# Patient Record
Sex: Female | Born: 2000 | Race: White | Hispanic: No | Marital: Single | Smoking: Never smoker
Health system: Southern US, Community
[De-identification: ages and names within clinical notes are randomized; demographics above are authoritative.]

## PROBLEM LIST (undated history)

## (undated) DIAGNOSIS — F419 Anxiety disorder, unspecified: Secondary | ICD-10-CM

## (undated) DIAGNOSIS — N390 Urinary tract infection, site not specified: Secondary | ICD-10-CM

## (undated) DIAGNOSIS — K589 Irritable bowel syndrome without diarrhea: Secondary | ICD-10-CM

## (undated) DIAGNOSIS — F32A Depression, unspecified: Secondary | ICD-10-CM

## (undated) HISTORY — DX: Anxiety disorder, unspecified: F41.9

## (undated) HISTORY — DX: Depression, unspecified: F32.A

## (undated) HISTORY — DX: Urinary tract infection, site not specified: N39.0

## (undated) HISTORY — DX: Irritable bowel syndrome, unspecified: K58.9

## (undated) HISTORY — PX: MOUTH SURGERY: SHX715

---
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
## 2021-03-02 ENCOUNTER — Other Ambulatory Visit: Payer: Self-pay

## 2021-03-02 ENCOUNTER — Encounter (HOSPITAL_COMMUNITY): Payer: Self-pay

## 2021-03-02 ENCOUNTER — Ambulatory Visit (HOSPITAL_COMMUNITY)
Admission: EM | Admit: 2021-03-02 | Discharge: 2021-03-02 | Disposition: A | Discharge status: Home / Self Care | Payer: PRIVATE HEALTH INSURANCE | Attending: Medical Oncology | Admitting: Medical Oncology

## 2021-03-02 DIAGNOSIS — N3001 Acute cystitis with hematuria: Secondary | ICD-10-CM

## 2021-03-02 LAB — POCT URINALYSIS DIPSTICK, ED / UC
Glucose, UA: 500 mg/dL — AB
Ketones, ur: 15 mg/dL — AB
Nitrite: POSITIVE — AB
Protein, ur: >=300 mg/dL — AB
Specific Gravity, Urine: <=1.005 (ref 1.005–1.030)
Urobilinogen, UA: >=8 mg/dL (ref 0.0–1.0)
pH: 5 (ref 5.0–8.0)

## 2021-03-02 LAB — CBG MONITORING, ED: Glucose-Capillary: 101 mg/dL — ABNORMAL HIGH (ref 70–99)

## 2021-03-02 MED ORDER — SULFAMETHOXAZOLE-TRIMETHOPRIM 800-160 MG PO TABS: 1.0000 | ORAL_TABLET | Freq: Two times a day (BID) | ORAL | 0 refills | Status: AC

## 2021-03-02 MED ORDER — SULFAMETHOXAZOLE-TRIMETHOPRIM 800-160 MG PO TABS: 1.0000 | ORAL_TABLET | Freq: Two times a day (BID) | ORAL | 0 refills | Status: DC

## 2021-03-02 NOTE — ED Triage Notes (Signed)
Pt in with c/o burning during urination and and hematuria that she noticed yesterday

## 2021-03-02 NOTE — ED Provider Notes (Signed)
[facility]    CSN: [id number] Arrival date & time: 03/02/21  1140      History   Chief Complaint Chief Complaint  Patient presents with  . burning during urination  . Hematuria    HPI Susan Buchanan is a 20 y.o. female.   HPI   Dysuria: Patient reports that for the past 2 days she has had dysuria and this morning she had some hematuria.  She suspects she has a urinary tract infection.  She has not had any significant abdominal pains but has had some frequency.  No fevers, vomiting or diarrhea.  She has tried to stay hydrated with water which is helped symptoms some.  She also took an Azo which did help symptoms some. LMC: 2 weeks ago.   History reviewed. No pertinent past medical history.  There are no problems to display for this patient.   History reviewed. No pertinent surgical history.  OB History   No obstetric history on file.      Home Medications    Prior to Admission medications   Medication Sig Start Date End Date Taking? Authorizing Provider  Biotin 1 MG CAPS Take by mouth.    [provider]  loperamide (IMODIUM) 2 MG capsule Take 1 capsule (2 mg total) by mouth 4 (four) times daily as needed for diarrhea or loose stools. 07/08/20   Wallis Bamberg, PA-C  Melatonin 5 MG TABS Take 5 mg by mouth at bedtime as needed (sleep).    [provider]  sulfamethoxazole-trimethoprim (BACTRIM DS) 800-160 MG tablet Take 1 tablet by mouth 2 (two) times daily for 3 days. 03/02/21 03/05/21  Rushie Chestnut, PA-C    Family History Family History  Family history unknown: Yes    Social History Social History   Tobacco Use  . Smoking status: Never Smoker  . Smokeless tobacco: Never Used  Vaping Use  . Vaping Use: Never used  Substance Use Topics  . Alcohol use: Never     Allergies   Latex   Review of Systems Review of Systems  As stated above in HPI Physical Exam Triage Vital Signs ED Triage Vitals  Enc Vitals Group     BP  03/02/21 1250 (!) 111/54     Pulse Rate 03/02/21 1248 77     Resp 03/02/21 1248 19     Temp 03/02/21 1248 98.3 F (36.8 C)     Temp src --      SpO2 03/02/21 1248 100 %     Weight --      Height --      Head Circumference --      Peak Flow --      Pain Score 03/02/21 1246 0     Pain Loc --      Pain Edu? --      Excl. in GC? --    No data found.  Updated Vital Signs BP (!) 111/54   Pulse 77   Temp 98.3 F (36.8 C)   Resp 19   LMP 02/15/2021   SpO2 100%   Physical Exam Vitals and nursing note reviewed.  Constitutional:      General: She is not in acute distress.    Appearance: Normal appearance. She is not ill-appearing, toxic-appearing or diaphoretic.  HENT:     Head: Normocephalic.  Cardiovascular:     Rate and Rhythm: Normal rate and regular rhythm.     Heart sounds: Normal heart sounds.  Pulmonary:  Effort: Pulmonary effort is normal.     Breath sounds: Normal breath sounds.  Abdominal:     General: Abdomen is flat. Bowel sounds are normal. There is no distension.     Palpations: Abdomen is soft. There is no mass.     Tenderness: There is no abdominal tenderness. There is no right CVA tenderness, left CVA tenderness, guarding or rebound.     Hernia: No hernia is present.  Musculoskeletal:     Cervical back: Normal range of motion and neck supple.  Lymphadenopathy:     Cervical: No cervical adenopathy.  Neurological:     Mental Status: She is alert.      UC Treatments / Results  Labs (all labs ordered are listed, but only abnormal results are displayed) Labs Reviewed  POCT URINALYSIS DIPSTICK, ED / UC - Abnormal; Notable for the following components:      Result Value   Glucose, UA 500 (*)    Bilirubin Urine MODERATE (*)    Ketones, ur 15 (*)    Hgb urine dipstick MODERATE (*)    Protein, ur >=300 (*)    Nitrite POSITIVE (*)    Leukocytes,Ua LARGE (*)    All other components within normal limits  CBG MONITORING, ED - Abnormal; Notable for the  following components:   Glucose-Capillary 101 (*)    All other components within normal limits    EKG   Radiology No results found.  Procedures Procedures (including critical care time)  Medications Ordered in UC Medications - No data to display  Initial Impression / Assessment and Plan / UC Course  I have reviewed the triage vital signs and the nursing notes.  Pertinent labs & imaging results that were available during my care of the patient were reviewed by me and considered in my medical decision making (see chart for details).     New.  Her point-of-care glucose testing today is 101.  Her UA result is likely skewed from her Azo.  I am going to have her hydrate with water and work on to treat her for urinary tract infection.  Discussed how to take her medication along with common potential side effects and precautions.   Final Clinical Impressions(s) / UC Diagnoses   Final diagnoses:  Acute cystitis with hematuria   Discharge Instructions   None    ED Prescriptions    Medication Sig Dispense Auth. Provider   sulfamethoxazole-trimethoprim (BACTRIM DS) 800-160 MG tablet  (Status: Discontinued) Take 1 tablet by mouth 2 (two) times daily for 7 days. 14 tablet Garhett Bernhard M, PA-C   sulfamethoxazole-trimethoprim (BACTRIM DS) 800-160 MG tablet Take 1 tablet by mouth 2 (two) times daily for 3 days. 6 tablet Rushie Chestnut, [place]     PDMP not reviewed this encounter.   Rushie Chestnut, [place] 03/02/21 1344

## 2022-09-13 ENCOUNTER — Emergency Department (HOSPITAL_COMMUNITY)
Admission: EM | Admit: 2022-09-13 | Discharge: 2022-09-14 | Disposition: A | Discharge status: Home / Self Care | Payer: No Typology Code available for payment source | Attending: Emergency Medicine | Admitting: Emergency Medicine

## 2022-09-13 ENCOUNTER — Other Ambulatory Visit: Payer: Self-pay

## 2022-09-13 ENCOUNTER — Encounter (HOSPITAL_COMMUNITY): Payer: Self-pay | Admitting: Emergency Medicine

## 2022-09-13 DIAGNOSIS — R197 Diarrhea, unspecified: Secondary | ICD-10-CM | POA: Insufficient documentation

## 2022-09-13 DIAGNOSIS — Z91040 Latex allergy status: Secondary | ICD-10-CM | POA: Diagnosis not present

## 2022-09-13 DIAGNOSIS — R1084 Generalized abdominal pain: Secondary | ICD-10-CM | POA: Insufficient documentation

## 2022-09-13 DIAGNOSIS — R1110 Vomiting, unspecified: Secondary | ICD-10-CM | POA: Insufficient documentation

## 2022-09-13 DIAGNOSIS — Z20822 Contact with and (suspected) exposure to covid-19: Secondary | ICD-10-CM | POA: Diagnosis not present

## 2022-09-13 LAB — COMPREHENSIVE METABOLIC PANEL
ALT: 17 U/L (ref 0–44)
AST: 17 U/L (ref 15–41)
Albumin: 4.4 g/dL (ref 3.5–5.0)
Alkaline Phosphatase: 65 U/L (ref 38–126)
Anion gap: 10 (ref 5–15)
BUN: 11 mg/dL (ref 6–20)
CO2: 23 mmol/L (ref 22–32)
Calcium: 9.9 mg/dL (ref 8.9–10.3)
Chloride: 106 mmol/L (ref 98–111)
Creatinine, Ser: 0.64 mg/dL (ref 0.44–1.00)
GFR, Estimated: >60 mL/min (ref >60)
Glucose, Bld: 86 mg/dL (ref 70–99)
Potassium: 3.9 mmol/L (ref 3.5–5.1)
Sodium: 139 mmol/L (ref 135–145)
Total Bilirubin: 0.6 mg/dL (ref 0.3–1.2)
Total Protein: 8.2 g/dL — ABNORMAL HIGH (ref 6.5–8.1)

## 2022-09-13 LAB — CBC WITH DIFFERENTIAL/PLATELET
Abs Immature Granulocytes: 0.03 10*3/uL (ref 0.00–0.07)
Basophils Absolute: 0 10*3/uL (ref 0.0–0.1)
Basophils Relative: 0 %
Eosinophils Absolute: 0 10*3/uL (ref 0.0–0.5)
Eosinophils Relative: 0 %
HCT: 39.6 % (ref 36.0–46.0)
Hemoglobin: 13.5 g/dL (ref 12.0–15.0)
Immature Granulocytes: 0 %
Lymphocytes Relative: 21 %
Lymphs Abs: 2.2 10*3/uL (ref 0.7–4.0)
MCH: 29.7 pg (ref 26.0–34.0)
MCHC: 34.1 g/dL (ref 30.0–36.0)
MCV: 87 fL (ref 80.0–100.0)
Monocytes Absolute: 0.5 10*3/uL (ref 0.1–1.0)
Monocytes Relative: 5 %
Neutro Abs: 7.6 10*3/uL (ref 1.7–7.7)
Neutrophils Relative %: 74 %
Platelets: 356 10*3/uL (ref 150–400)
RBC: 4.55 MIL/uL (ref 3.87–5.11)
RDW: 11.9 % (ref 11.5–15.5)
WBC: 10.3 10*3/uL (ref 4.0–10.5)
nRBC: 0 % (ref 0.0–0.2)

## 2022-09-13 LAB — RESP PANEL BY RT-PCR (FLU A&B, COVID) ARPGX2
Influenza A by PCR: NEGATIVE
Influenza B by PCR: NEGATIVE
SARS Coronavirus 2 by RT PCR: NEGATIVE

## 2022-09-13 LAB — I-STAT BETA HCG BLOOD, ED (MC, WL, AP ONLY): I-stat hCG, quantitative: <5 m[IU]/mL (ref <5)

## 2022-09-13 LAB — LIPASE, BLOOD: Lipase: 29 U/L (ref 11–51)

## 2022-09-13 NOTE — ED Triage Notes (Signed)
Presents for abd pain and diarrhea x 1 week. Suprapubic cramping and stabbing is primary concern, bloating. Has been intermittent for 1 year. Some blood noted in stool, dark; nausea. Deneis fever, chill Denies previous abd surgeries Currently menstruating.

## 2022-09-13 NOTE — ED Provider Triage Note (Signed)
Emergency Medicine Provider Triage Evaluation Note  Susan Buchanan , a 21 y.o. female  was evaluated in triage.  Pt complains of diarrhea.  History is given by the patient and her mother at bedside.  She has been having abdominal pain and abdominal symptoms for about a year but has not had an evaluation.  She complains of intermittent diarrhea and constipation.  This week she has had pure diarrhea.  Constant epigastric and lower abdominal pain and cramping.  She has a history of lactose intolerance.  She has had nausea without vomiting..  Review of Systems  Positive: Diarrhea Negative: Fever  Physical Exam  BP 122/76 (BP Location: Left Arm)   Pulse 72   Temp 98.2 F (36.8 C) (Oral)   Resp 14   SpO2 100%  Gen:   Awake, no distress   Resp:  Normal effort  MSK:   Moves extremities without difficulty  Other:  Mild epigastric abdominal tenderness  Medical Decision Making  Medically screening exam initiated at 8:13 PM.  Appropriate orders placed.  Concetta Wilmer was informed that the remainder of the evaluation will be completed by another provider, this initial triage assessment does not replace that evaluation, and the importance of remaining in the ED until their evaluation is complete.  Work-up initiated   Margarita Mail, PA-C 09/13/22 2015

## 2022-09-14 ENCOUNTER — Encounter (HOSPITAL_COMMUNITY): Payer: Self-pay

## 2022-09-14 ENCOUNTER — Emergency Department (HOSPITAL_COMMUNITY): Payer: No Typology Code available for payment source

## 2022-09-14 DIAGNOSIS — R1084 Generalized abdominal pain: Secondary | ICD-10-CM | POA: Diagnosis not present

## 2022-09-14 LAB — URINALYSIS, ROUTINE W REFLEX MICROSCOPIC
Bilirubin Urine: NEGATIVE
Glucose, UA: NEGATIVE mg/dL
Ketones, ur: 20 mg/dL — AB
Leukocytes,Ua: NEGATIVE
Nitrite: NEGATIVE
Protein, ur: NEGATIVE mg/dL
Specific Gravity, Urine: 1.029 (ref 1.005–1.030)
pH: 5 (ref 5.0–8.0)

## 2022-09-14 MED ORDER — LIDOCAINE VISCOUS HCL 2 % MT SOLN
15.0000 mL | Freq: Once | OROMUCOSAL | Status: AC
Start: 2022-09-14 — End: 2022-09-14
  Administered 2022-09-14: 15 mL via ORAL
  Filled 2022-09-14: qty 15

## 2022-09-14 MED ORDER — OMEPRAZOLE 20 MG PO CPDR: 20.0000 mg | DELAYED_RELEASE_CAPSULE | Freq: Every day | ORAL | 0 refills | Status: DC

## 2022-09-14 MED ORDER — ALUM & MAG HYDROXIDE-SIMETH 200-200-20 MG/5ML PO SUSP
30.0000 mL | Freq: Once | ORAL | Status: AC
  Administered 2022-09-14: 30 mL via ORAL
  Filled 2022-09-14: qty 30

## 2022-09-14 NOTE — ED Provider Notes (Signed)
[facility] EMERGENCY DEPARTMENT Provider Note   CSN: [id number] Arrival date & time: 09/13/22  1751     History  Chief Complaint  Patient presents with   Abdominal Pain    Susan Buchanan is a 21 y.o. female.   Abdominal Pain    Patient without documented comorbidities presents today due to abdominal pain x1 year.  It is worse after she eats, associated sometimes with nonbilious nonbloody emesis other times with immediate diarrhea.  Pain is like a cramping pain, sometimes is to the upper abdomen, sometimes it suprapubic.  She states for the last year she has been having episodes of constipation followed by bouts of diarrhea per time she eats.  Denies any dark and tarry stool, endorses slight streaks of bright red blood in her stool intermittently.  She has not tried any over-the-counter medicine other than Tylenol which helps somewhat.  No history of GERD or reflux, not sexually active in 1 year and denies any vaginal discharge, pelvic pain, dysuria.  No previous abdominal surgeries.  Denies illicit drug use, denies alcohol.  Home Medications Prior to Admission medications   Medication Sig Start Date End Date Taking? Authorizing Provider  omeprazole (PRILOSEC) 20 MG capsule Take 1 capsule (20 mg total) by mouth daily. 09/14/22  Yes Sherrill Raring, PA-C  loperamide (IMODIUM) 2 MG capsule Take 1 capsule (2 mg total) by mouth 4 (four) times daily as needed for diarrhea or loose stools. Patient not taking: Reported on 09/14/2022 07/08/20   Jaynee Eagles, PA-C      Allergies    Latex and Wound dressing adhesive    Review of Systems   Review of Systems  Gastrointestinal:  Positive for abdominal pain.    Physical Exam Updated Vital Signs BP (!) 131/94   Pulse 74   Temp 98.6 F (37 C) (Oral)   Resp 16   Ht 5\' 9"  (1.753 m)   Wt 90.7 kg   LMP 09/12/2022 (Exact Date)   SpO2 100%   BMI 29.53 kg/m  Physical Exam Vitals and nursing note reviewed. Exam conducted with a  chaperone present.  Constitutional:      Appearance: Normal appearance. She is obese.  HENT:     Head: Normocephalic and atraumatic.  Eyes:     General: No scleral icterus.       Right eye: No discharge.        Left eye: No discharge.     Extraocular Movements: Extraocular movements intact.     Pupils: Pupils are equal, round, and reactive to light.  Cardiovascular:     Rate and Rhythm: Normal rate and regular rhythm.     Pulses: Normal pulses.     Heart sounds: Normal heart sounds. No murmur heard.    No friction rub. No gallop.  Pulmonary:     Effort: Pulmonary effort is normal. No respiratory distress.     Breath sounds: Normal breath sounds.  Abdominal:     General: Abdomen is flat. Bowel sounds are normal. There is no distension.     Palpations: Abdomen is soft.     Tenderness: There is generalized abdominal tenderness. There is no guarding or rebound. Positive signs include Murphy's sign.     Hernia: No hernia is present.     Comments: Abdomen is soft, generalized abdominal tenderness.  Positive Murphy sign, no rebound, guarding, rigidity.  Genitourinary:    Comments: Deferred Skin:    General: Skin is warm and dry.     Coloration:  Skin is not jaundiced.  Neurological:     Mental Status: She is alert. Mental status is at baseline.     Coordination: Coordination normal.     ED Results / Procedures / Treatments   Labs (all labs ordered are listed, but only abnormal results are displayed) Labs Reviewed  COMPREHENSIVE METABOLIC PANEL - Abnormal; Notable for the following components:      Result Value   Total Protein 8.2 (*)    All other components within normal limits  URINALYSIS, ROUTINE W REFLEX MICROSCOPIC - Abnormal; Notable for the following components:   APPearance HAZY (*)    Hgb urine dipstick LARGE (*)    Ketones, ur 20 (*)    Bacteria, UA RARE (*)    All other components within normal limits  RESP PANEL BY RT-PCR (FLU A&B, COVID) ARPGX2  CBC WITH  DIFFERENTIAL/PLATELET  LIPASE, BLOOD  I-STAT BETA HCG BLOOD, ED ([facility], [facility], [facility] ONLY)    EKG None  Radiology US Abdomen Limited RUQ (LIVER/GB)  Result Date: 09/14/2022 CLINICAL DATA:  Right upper quadrant abdominal pain EXAM: ULTRASOUND ABDOMEN LIMITED RIGHT UPPER QUADRANT COMPARISON:  None available FINDINGS: Gallbladder: Gallstones: None Sludge: None Gallbladder Wall: Within normal limits Pericholecystic fluid: None Sonographic Murphy's Sign: Negative per technologist Common bile duct: Diameter: 2 mm Liver: Parenchymal echogenicity: Within normal limits Contours: Normal Lesions: None Portal vein: Patent.  Hepatopetal flow Other: None. IMPRESSION: No significant sonographic abnormality of the liver or gallbladder. Electronically Signed   By: Acquanetta Belling M.D.   On: 09/14/2022 10:30    Procedures Procedures    Medications Ordered in ED Medications  alum & mag hydroxide-simeth (MAALOX/MYLANTA) 200-200-20 MG/5ML suspension 30 mL (30 mLs Oral Given 09/14/22 0841)    And  lidocaine (XYLOCAINE) 2 % viscous mouth solution 15 mL (15 mLs Oral Given 09/14/22 0841)    ED Course/ Medical Decision Making/ A&P                           Medical Decision Making Amount and/or Complexity of Data Reviewed Radiology: ordered.  Risk OTC drugs. Prescription drug management.   Patient presents due to abdominal pain, vomiting, diarrhea.  Differential includes but not limited to ruptured ectopic pregnancy, ovarian torsion, TOA, GERD, gastritis, ovarian cyst, UTI, pancreatitis, pyelonephritis, gastric ulcer, colitis, IBD, PID, cholecystitis, appendicitis, SBO, COVID.  Patient's abdomen is soft, she is nontoxic-appearing. Generalized abdominal tenderness but slightly worse to right upper quadrant with positive Murphy sign..  No peritoneal signs.  Nonseptic appearing. -BP (!) 131/94   Pulse 74   Temp 98.6 F (37 C) (Oral)   Resp 16   Ht 5\' 9"  (1.753 m)   Wt 90.7 kg   LMP 09/12/2022 (Exact Date)   SpO2  100%   BMI 29.53 kg/m   Patient's mother is independent historian.  She left the room when I question the patient about sexual history.  I reviewed external medical records, patient has been seen in the ED for generalized abdominal pain but has not had PCP follow-up or gastroenterology follow-up.  I ordered, viewed and interpreted laboratory work-up. -CBC without leukocytosis or anemia. -Lipase is within normal limits, no pancreatitis -CMP without gross electrolyte derangement, AKI or transaminitis. -Patient is not pregnant, not ectopic. -Respiratory panel is negative for COVID or flu. -UA shows some slight ketonuria, no signs of UTI.  There is large amount hematuria patient is currently on her menstrual cycle.  No CVA tenderness, doubt nephrolithiasis.  Given positive Murphy sign with history of postprandial abdominal pain we will proceed with right upper quadrant ultrasound to evaluate cholelithiasis versus cholecystitis.  I ordered, viewed and interpreted right upper quadrant ultrasound. Negative for gallstones, not consistent with cholecystitis.  Agree with radiologist interpretation.  I consider getting a CT of the abdomen but given the pain is generalized, has been going on for over a year and the abdominal exam is relatively benign I do not think a CT scan would be particularly helpful.    Patient is not having any GU complaints, no risk factors of sexual activity.  I considered pelvic but after shared decision making I do not think indicated.  Presentation is not consistent with ovarian cyst or torsion.    Although triage note mentions dark stool, patient states this more streaks of bright red blood in the toilet with urination and bowel movements intermittently x 1 year.  She notes it mostly during her menstrual cycles, denies rectal pain.  Given normal hemoglobin and chronicity of symptoms I do not think patient is having an acute GI bleed.  I think presentation is most consistent  with gastritis versus GERD versus IBD versus IBS versus other.  Patient is stable, I do not think additional work-up or hospitalization is indicated at this time.  We will start on acid suppressing medicine and provide GI follow-up.  Return precautions were discussed with patient who verbalized understanding and agreement with this plan.  I ordered GI cocktail, on reevaluation this improved the pain.  I reviewed patient's home medication list, will start patient on Prilosec given upper abdominal pain postprandial.         Final Clinical Impression(s) / ED Diagnoses Final diagnoses:  Generalized abdominal pain    Rx / DC Orders ED Discharge Orders          Ordered    omeprazole (PRILOSEC) 20 MG capsule  Daily        09/14/22 1037              Sherrill Raring, PA-C 09/14/22 1042    Blanchie Dessert, MD 09/14/22 1102

## 2022-09-14 NOTE — Discharge Instructions (Addendum)
Your work-up today was very reassuring.  The ultrasound does not show any gallstones or abnormalities.  I would like you to start taking acid suppressant medicine Prilosec.  Take 30 minutes before your first meal in the morning daily until you are seen by gastroenterology.  Try and avoid foods that are acidic in nature and see if that makes any difference in your symptoms.  Call gastroenterology Dr. Loletha Carrow and schedule an appointment for reevaluation.  If you are still having the symptoms of diarrhea alternating with constipation I do think we need GI evaluation and work-up for possible irritable bowel disease.  Return to the ED for severe pain that is 1 side of your body, inability to eat or drink without vomiting or new concerning symptoms.

## 2022-09-14 NOTE — ED Notes (Signed)
Pt provided written and verbal d/c instructions and rx home. Pt verbalizes understanding. Pt left dept. Via ambulation with family.

## 2022-11-16 ENCOUNTER — Other Ambulatory Visit (INDEPENDENT_AMBULATORY_CARE_PROVIDER_SITE_OTHER): Payer: PRIVATE HEALTH INSURANCE

## 2022-11-16 ENCOUNTER — Encounter: Payer: Self-pay | Admitting: Internal Medicine

## 2022-11-16 ENCOUNTER — Ambulatory Visit (INDEPENDENT_AMBULATORY_CARE_PROVIDER_SITE_OTHER): Payer: PRIVATE HEALTH INSURANCE | Admitting: Internal Medicine

## 2022-11-16 VITALS — BP 118/82 | HR 94 | Ht 69.0 in | Wt 219.2 lb

## 2022-11-16 DIAGNOSIS — R198 Other specified symptoms and signs involving the digestive system and abdomen: Secondary | ICD-10-CM

## 2022-11-16 DIAGNOSIS — R1013 Epigastric pain: Secondary | ICD-10-CM | POA: Diagnosis not present

## 2022-11-16 DIAGNOSIS — K219 Gastro-esophageal reflux disease without esophagitis: Secondary | ICD-10-CM

## 2022-11-16 DIAGNOSIS — K625 Hemorrhage of anus and rectum: Secondary | ICD-10-CM

## 2022-11-16 DIAGNOSIS — R1030 Lower abdominal pain, unspecified: Secondary | ICD-10-CM

## 2022-11-16 LAB — C-REACTIVE PROTEIN: CRP: 1.1 mg/dL (ref 0.5–20.0)

## 2022-11-16 LAB — H. PYLORI ANTIBODY, IGG: H Pylori IgG: NEGATIVE

## 2022-11-16 LAB — TSH: TSH: 1.42 u[IU]/mL (ref 0.35–5.50)

## 2022-11-16 MED ORDER — HYDROCORTISONE (PERIANAL) 2.5 % EX CREA: 1.0000 | TOPICAL_CREAM | Freq: Two times a day (BID) | CUTANEOUS | 1 refills | Status: AC

## 2022-11-16 MED ORDER — OMEPRAZOLE 20 MG PO CPDR: 20.0000 mg | DELAYED_RELEASE_CAPSULE | Freq: Every day | ORAL | 2 refills | Status: DC

## 2022-11-16 MED ORDER — DICYCLOMINE HCL 10 MG PO CAPS: 10.0000 mg | ORAL_CAPSULE | Freq: Three times a day (TID) | ORAL | 2 refills | Status: DC | PRN

## 2022-11-16 NOTE — Patient Instructions (Addendum)
If you are age 20 or younger, your body mass index should be between 19-25. Your Body mass index is 32.38 kg/m. If this is out of the aformentioned range listed, please consider follow up with your Primary Care Provider.  ________________________________________________________  The [facility] providers would like to encourage you to use [facility] to communicate with providers for non-urgent requests or questions.  Due to long hold times on the telephone, sending your provider a message by [facility] may be a faster and more efficient way to get a response.  Please allow 48 business hours for a response.  Please remember that this is for non-urgent requests.  _______________________________________________________  Your provider has requested that you go to the basement level for lab work before leaving today. Press "B" on the elevator. The lab is located at the first door on the left as you exit the elevator.  We have sent the following medications to your pharmacy for you to pick up at your convenience:  START: Bentyl 10mg  one tablet three times daily as needed. START: Anusol cream apply to rectum twice daily for 7 days  CONTINUE: Omeprazole 20mg  one capsule daily.   USE: recticare as directed.   You are scheduled to follow up in our office on 12-26-22 at 3:40pm  Due to recent changes in healthcare laws, you may see the results of your imaging and laboratory studies on MyChart before your provider has had a chance to review them.  We understand that in some cases there may be results that are confusing or concerning to you. Not all laboratory results come back in the same time frame and the provider may be waiting for multiple results in order to interpret others.  Please give 48 hours in order for your provider to thoroughly review all the results before contacting the office for clarification of your results.   Thank you for entrusting me with your care and choosing [facility].  Dr  [place]

## 2022-11-16 NOTE — Progress Notes (Signed)
Chief Complaint: Ab pain  HPI : 21 year old female with history of depression, anxiety, and obesity presents with ab pain  She has had ab pain for the last 2-3 years. This pain is located in the lower abdomen and epigastric area. The pain feels like a stabbing and burning sensation. The pain will improve after she lays down. Taking two Advil and Tylenol will help with the pain. She takes Advil usually once every few days. Having a BM does not affect the pain. Eating will sometimes help and other times worsen the pain. She feels better when she doesn't eat. She took omeprazole 20 mg QD, which helped with the acid reflux symptoms but not with the pain. She endorses nausea but denies vomiting. She has random BMs. She will have alternating constipation and diarrhea, but she tends to be more constipated. She has rectal bleeding on occasion, particularly when she is constipated. The bleeding is only on the toilet paper. Also has some rectal pain. Maternal grandmother had rectal cancer, diverticulitis, and IBS. Denies dysphagia. Denies prior colonoscopy or EGD. Denies weight loss.   Wt Readings from Last 3 Encounters:  11/16/22 219 lb 4 oz (99.5 kg)  09/13/22 200 lb (90.7 kg)  08/17/13 98 lb (44.5 kg) (76 %, Z= 0.70)*   * Growth percentiles are based on CDC (Girls, 2-20 Years) data.   Past Medical History:  Diagnosis Date   Anxiety    Depression    IBS (irritable bowel syndrome)    UTI (urinary tract infection)    Past Surgical History:  Procedure Laterality Date   MOUTH SURGERY     2021. Due to a baby tooth that didnt come out and an abcess   Family History  Problem Relation Age of Onset   Rectal cancer Maternal Grandmother    Clotting disorder Maternal Grandmother    Irritable bowel syndrome Maternal Grandmother    Prostate cancer Maternal Grandfather    Esophageal cancer Neg Hx    Social History   Tobacco Use   Smoking status: Never   Smokeless tobacco: Never  Vaping Use    Vaping Use: Never used  Substance Use Topics   Alcohol use: Never   Drug use: Never   Current Outpatient Medications  Medication Sig Dispense Refill   Acetaminophen (TYLENOL PO) Take 1 tablet by mouth as needed.     IBUPROFEN PO Take 1 tablet by mouth as needed.     omeprazole (PRILOSEC) 20 MG capsule Take 1 capsule (20 mg total) by mouth daily. 60 capsule 0   No current facility-administered medications for this visit.   Allergies  Allergen Reactions   Latex Other (See Comments)    Allergic to bandaids   Wound Dressing Adhesive Rash   Review of Systems: All systems reviewed and negative except where noted in HPI.   Physical Exam: BP 118/82   Pulse 94   Ht 5\' 9"  (1.753 m)   Wt 219 lb 4 oz (99.5 kg)   BMI 32.38 kg/m  Constitutional: Pleasant,well-developed, female in no acute distress. HEENT: Normocephalic and atraumatic. Conjunctivae are normal. No scleral icterus. Cardiovascular: Normal rate, regular rhythm.  Pulmonary/chest: Effort normal and breath sounds normal. No wheezing, rales or rhonchi. Abdominal: Soft, nondistended, nontender. Bowel sounds active throughout. There are no masses palpable. No hepatomegaly. Rectal: Internal hemorrhoids present on rectal exam Extremities: No edema Neurological: Alert and oriented to person place and time. Skin: Skin is warm and dry. No rashes noted. Psychiatric: Normal mood and  affect. Behavior is normal.  Labs 08/2022: CBC and CMP unremarkable. Lipase nml.  RUQ U/S 09/14/22: IMPRESSION: No significant sonographic abnormality of the liver or gallbladder.  ASSESSMENT AND PLAN: Epigastric and lower abdominal pain GERD Alternating constipation and diarrhea Rectal bleeding Rectal pain Hemorrhoids Patient presents with abdominal pain for the last 2-3 years. She was started on PPI therapy and has had some benefit in her GERD but not with her ab pain. She does have irregular bowel habits that could suggest IBS as a source of her ab  pain. Alternatively could consider PUD with her history of NSAID use or GERD. Will start her on some therapies for IBS and rule out alternative sources of ab pain with labs. Her rectal bleeding and pain is likely due to hemorrhoids so will start her on some Recticare and Anusol cream. I encouraged her to try to avoid straining and sitting on the toilet for long periods of time.  - Low FODMAP diet - Check CRP, TSH, TTG IgA, IgA, H pylori antibody - Continue omeprazole 20 mg QD. Refill - Start Bentyl 10 mg TID PRN - Anusol cream BID for 7 days - Recticare PRN. Will give samples. - RTC 6 weeks  Eulah Pont, MD  I spent 60 minutes of time, including in depth chart review, independent review of results as outlined above, communicating results with the patient directly, face-to-face time with the patient, coordinating care, ordering studies and medications as appropriate, and documentation.

## 2022-11-17 LAB — TISSUE TRANSGLUTAMINASE, IGA: (tTG) Ab, IgA: <1 U/mL

## 2022-11-17 LAB — IGA: Immunoglobulin A: 220 mg/dL (ref 47–310)

## 2022-12-12 ENCOUNTER — Ambulatory Visit
Admission: EM | Admit: 2022-12-12 | Discharge: 2022-12-12 | Disposition: A | Discharge status: Home / Self Care | Payer: PRIVATE HEALTH INSURANCE | Attending: Family Medicine | Admitting: Family Medicine

## 2022-12-12 DIAGNOSIS — N3090 Cystitis, unspecified without hematuria: Secondary | ICD-10-CM | POA: Diagnosis present

## 2022-12-12 LAB — POCT URINALYSIS DIP (MANUAL ENTRY)
Bilirubin, UA: NEGATIVE
Glucose, UA: 100 mg/dL — AB
Ketones, POC UA: NEGATIVE mg/dL
Nitrite, UA: POSITIVE — AB
Protein Ur, POC: 30 mg/dL — AB
Spec Grav, UA: 1.02 (ref 1.010–1.025)
Urobilinogen, UA: 1 E.U./dL
pH, UA: 6.5 (ref 5.0–8.0)

## 2022-12-12 LAB — POCT URINE PREGNANCY: Preg Test, Ur: NEGATIVE

## 2022-12-12 MED ORDER — CEPHALEXIN 500 MG PO CAPS: 500.0000 mg | ORAL_CAPSULE | Freq: Two times a day (BID) | ORAL | 0 refills | Status: DC

## 2022-12-12 NOTE — ED Provider Notes (Signed)
[place]    ASSESSMENT & PLAN:  1. Cystitis    Meds ordered this encounter  Medications   cephALEXin (KEFLEX) 500 MG capsule    Sig: Take 1 capsule (500 mg total) by mouth 2 (two) times daily.    Dispense:  10 capsule    Refill:  0   No signs of pyelonephritis. Discussed. Urine culture sent. Will notify patient of any significant results. Will follow up with her PCP or here if not showing improvement over the next 48 hours, sooner if needed.  Outlined signs and symptoms indicating need for more acute intervention. Patient verbalized understanding. After Visit Summary given.  SUBJECTIVE:  Susan Buchanan is a 21 y.o. female who complains of urinary frequency, urgency and dysuria for the past 3 days. Without associated flank pain, fever, chills, vaginal discharge or bleeding. Gross hematuria: not present. No specific aggravating or alleviating factors reported. No LE edema. Normal PO intake without n/v/d. Without specific abdominal pain. Ambulatory without difficulty. No tx PTA.  LMP: Patient's last menstrual period was 12/05/2022 (approximate).  OBJECTIVE:  Vitals:   12/12/22 1908  BP: 118/83  Pulse: 80  Resp: 16  Temp: 97.9 F (36.6 C)  SpO2: 98%   General appearance: alert; no distress HENT: oropharynx: moist Lungs: unlabored respirations Abdomen: benign Back: no CVA tenderness Extremities: no edema; symmetrical with no gross deformities Skin: warm and dry Neurologic: normal gait Psychological: alert and cooperative; normal mood and affect  Labs Reviewed  POCT URINALYSIS DIP (MANUAL ENTRY) - Abnormal; Notable for the following components:      Result Value   Color, UA orange (*)    Glucose, UA =100 (*)    Blood, UA small (*)    Protein Ur, POC =30 (*)    Nitrite, UA Positive (*)    Leukocytes, UA Trace (*)    All other components within normal limits  URINE CULTURE  POCT URINE PREGNANCY    Allergies  Allergen Reactions   Latex Other (See  Comments)    Allergic to bandaids   Wound Dressing Adhesive Rash    Past Medical History:  Diagnosis Date   Anxiety    Depression    IBS (irritable bowel syndrome)    UTI (urinary tract infection)    Social History   Socioeconomic History   Marital status: Single    Spouse name: Not on file   Number of children: Not on file   Years of education: Not on file   Highest education level: Not on file  Occupational History   Not on file  Tobacco Use   Smoking status: Never   Smokeless tobacco: Never  Vaping Use   Vaping Use: Never used  Substance and Sexual Activity   Alcohol use: Never   Drug use: Never   Sexual activity: Not on file  Other Topics Concern   Not on file  Social History Narrative   Not on file   Social Determinants of Health   Financial Resource Strain: Not on file  Food Insecurity: Not on file  Transportation Needs: Not on file  Physical Activity: Not on file  Stress: Not on file  Social Connections: Not on file  Intimate Partner Violence: Not on file   Family History  Problem Relation Age of Onset   Rectal cancer Maternal Grandmother    Clotting disorder Maternal Grandmother    Irritable bowel syndrome Maternal Grandmother    Prostate cancer Maternal Grandfather    Esophageal cancer Neg Hx  Mardella Layman, MD 12/12/22 Barry Brunner

## 2022-12-12 NOTE — Discharge Instructions (Addendum)
You have had labs (urine cuture) sent today. We will call you with any significant abnormalities or if there is need to begin or change treatment or pursue further follow up.  You may also review your test results online through MyChart. If you do not have a MyChart account, instructions to sign up should be on your discharge paperwork.

## 2022-12-12 NOTE — ED Triage Notes (Signed)
Pt presents to [facility] with co of dysuria for 3 days. Pt has taken otc for urinary pain.

## 2022-12-15 LAB — URINE CULTURE: Culture: >=100000 — AB

## 2022-12-26 ENCOUNTER — Ambulatory Visit (INDEPENDENT_AMBULATORY_CARE_PROVIDER_SITE_OTHER): Payer: PRIVATE HEALTH INSURANCE | Admitting: Internal Medicine

## 2022-12-26 ENCOUNTER — Encounter: Payer: Self-pay | Admitting: Internal Medicine

## 2022-12-26 VITALS — BP 110/78 | HR 110 | Ht 69.0 in | Wt 216.0 lb

## 2022-12-26 DIAGNOSIS — R198 Other specified symptoms and signs involving the digestive system and abdomen: Secondary | ICD-10-CM

## 2022-12-26 DIAGNOSIS — R1030 Lower abdominal pain, unspecified: Secondary | ICD-10-CM

## 2022-12-26 DIAGNOSIS — R1013 Epigastric pain: Secondary | ICD-10-CM | POA: Diagnosis not present

## 2022-12-26 DIAGNOSIS — K219 Gastro-esophageal reflux disease without esophagitis: Secondary | ICD-10-CM

## 2022-12-26 DIAGNOSIS — R002 Palpitations: Secondary | ICD-10-CM

## 2022-12-26 DIAGNOSIS — R079 Chest pain, unspecified: Secondary | ICD-10-CM | POA: Diagnosis not present

## 2022-12-26 MED ORDER — PANTOPRAZOLE SODIUM 40 MG PO TBEC: 40.0000 mg | DELAYED_RELEASE_TABLET | Freq: Every day | ORAL | 3 refills | Status: DC

## 2022-12-26 NOTE — Progress Notes (Signed)
Chief Complaint: Ab pain  HPI : 22 year old female with history of depression, anxiety, and obesity presents with ab pain  Interval History: The omeprazole was helping initially but then stopped helping about a week ago. Endorses chest pressure. Endorses palpitations and SOB. Denies chest burning or regurgitation. The Bentyl helped for a while with her ab pain but then stopped working as well. Her ab pain still comes and goes, and is located in the upper and lower abdomen. She is alternating between constipation and diarrhea. She has been trying to eat more oatmeal, which has helped. Anusol cream seemed help with her rectal bleeding. Denies as much rectal bleeding.  Wt Readings from Last 3 Encounters:  12/26/22 216 lb (98 kg)  11/16/22 219 lb 4 oz (99.5 kg)  09/13/22 200 lb (90.7 kg)   Past Medical History:  Diagnosis Date   Anxiety    Depression    IBS (irritable bowel syndrome)    UTI (urinary tract infection)    Past Surgical History:  Procedure Laterality Date   MOUTH SURGERY     2021. Due to a baby tooth that didnt come out and an abcess   Family History  Problem Relation Age of Onset   Rectal cancer Maternal Grandmother    Clotting disorder Maternal Grandmother    Irritable bowel syndrome Maternal Grandmother    Prostate cancer Maternal Grandfather    Esophageal cancer Neg Hx    Stomach cancer Neg Hx    Social History   Tobacco Use   Smoking status: Never   Smokeless tobacco: Never  Vaping Use   Vaping Use: Never used  Substance Use Topics   Alcohol use: Never   Drug use: Never   Current Outpatient Medications  Medication Sig Dispense Refill   Acetaminophen (TYLENOL PO) Take 1 tablet by mouth as needed.     dicyclomine (BENTYL) 10 MG capsule Take 1 capsule (10 mg total) by mouth 3 (three) times daily as needed for spasms. 90 capsule 2   IBUPROFEN PO Take 1 tablet by mouth as needed.     pantoprazole (PROTONIX) 40 MG tablet Take 1 tablet (40 mg total) by mouth  daily. 30 tablet 3   No current facility-administered medications for this visit.   Allergies  Allergen Reactions   Latex Other (See Comments)    Allergic to bandaids   Wound Dressing Adhesive Rash   Review of Systems: All systems reviewed and negative except where noted in HPI.   Physical Exam: BP 110/78   Pulse (!) 110   Ht 5\' 9"  (1.753 m)   Wt 216 lb (98 kg)   LMP 12/05/2022 (Approximate)   SpO2 99%   BMI 31.90 kg/m  Constitutional: Pleasant,well-developed, female in no acute distress. HEENT: Normocephalic and atraumatic. Conjunctivae are normal. No scleral icterus. Cardiovascular: Normal rate, regular rhythm.  Pulmonary/chest: Effort normal and breath sounds normal. No wheezing, rales or rhonchi. Abdominal: Soft, nondistended, nontender. Bowel sounds active throughout. There are no masses palpable. No hepatomegaly. Rectal: Internal hemorrhoids present on rectal exam Extremities: No edema Neurological: Alert and oriented to person place and time. Skin: Skin is warm and dry. No rashes noted. Psychiatric: Normal mood and affect. Behavior is normal.  Labs 08/2022: CBC and CMP unremarkable. Lipase nml.  RUQ U/S 09/14/22: IMPRESSION: No significant sonographic abnormality of the liver or gallbladder.  ASSESSMENT AND PLAN: Epigastric and lower abdominal pain GERD Alternating constipation and diarrhea Hemorrhoids Patient presents for follow up of ab pain, which responded  to PPI and Bentyl for a while but then lost their efficacy. Patient today describes some issues with chest pressure, palpitations, and SOB so will refer her to cardiology to be evaluated. I will switch her PPI to pantoprazole and increase the dosage to see if this is more effective. She does state that she has been under more stress recently but does not have the funds to be able to afford a therapist. - Previously gave low FODMAP diet - Switch from omeprazole 20 mg QD to pantoprazole 40 mg QD - Cont Bentyl  10 mg TID PRN - Referral to cardiologist - RTC 3 months  Eulah Pont, MD  I spent 35 minutes of time, including in depth chart review, independent review of results as outlined above, communicating results with the patient directly, face-to-face time with the patient, coordinating care, ordering studies and medications as appropriate, and documentation.

## 2022-12-26 NOTE — Patient Instructions (Signed)
If you are age 21 or younger, your body mass index should be between 19-25. Your Body mass index is 31.9 kg/m. If this is out of the aformentioned range listed, please consider follow up with your Primary Care Provider.  ________________________________________________________  The [facility] providers would like to encourage you to use [facility] to communicate with providers for non-urgent requests or questions.  Due to long hold times on the telephone, sending your provider a message by [facility] may be a faster and more efficient way to get a response.  Please allow 48 business hours for a response.  Please remember that this is for non-urgent requests.  _______________________________________________________  We have sent the following medications to your pharmacy for you to pick up at your convenience:  DISCONTINUE: Omeprazole   START: pantoprazole 40 mg one tablet daily  We have referred you to cardiology.  Someone from their office will call you to schedule an appointment.  You will follow up in our office in 3 months.  We will contact you to schedule an appointment.  Thank you for entrusting me with your care and choosing [facility].  Dr Lorenso Courier

## 2023-01-14 NOTE — Progress Notes (Unsigned)
Cardiology Office Note:   Date:  01/15/2023  NAME:  Susan Buchanan    MRN: [id number] DOB:  04/30/2001   PCP:  Pcp, No  Cardiologist:  None  Electrophysiologist:  None   Referring MD: Imogene Burn, MD   Chief Complaint  Patient presents with   Chest Pain    History of Present Illness:   Susan Buchanan is a 22 y.o. female with a hx of GERD, IBS, depression who is being seen today for the evaluation of chest pain/palpitations at the request of Imogene Burn, MD. she reports for the past few years she has had episodes of chest pain.  Described as on and off.  Described as sharp and dull at times.  Symptoms can last every few weeks.  They can last for hours.  She reports no identifiable triggers.  She does have acid reflux.  She reports that the pain resolves within hours without doing anything specifically to resolve it.  She also reports rapid heartbeat sensation.  She reports her heart can race for hours.  She does suffer from anxiety and depression.  She believes this could be contributing.  TSH in November of this past year was normal at 1.42.  She presents with her mother.  There is no history of congenital heart disease.  Her EKG demonstrates sinus rhythm with no acute ischemic changes or evidence of infarction.  She does suffer from irritable bowel syndrome.  She reports this could be contributing.  Her CV examination is normal.  She does not smoke.  No alcohol or drug use is reported.  She has never had a heart attack or stroke.  No strong family history of heart disease.  Her blood pressure is within limits.  She reports she is currently unemployed.  She lives with her parents.  She is single.  Not married.  No children.  Does not exercise regularly.  She drinks plenty of water.  Diet could be improved upon.  She also reports shortness of breath with activity.  She has had this for years as well.  She is physically inactive.  No signs of heart failure.   Problem  List GERD/IBS Anxiety/depression   Past Medical History: Past Medical History:  Diagnosis Date   Anxiety    Depression    IBS (irritable bowel syndrome)    UTI (urinary tract infection)     Past Surgical History: Past Surgical History:  Procedure Laterality Date   MOUTH SURGERY     2021. Due to a baby tooth that didnt come out and an abcess    Current Medications: Current Meds  Medication Sig   pantoprazole (PROTONIX) 40 MG tablet Take 1 tablet (40 mg total) by mouth daily.     Allergies:    Latex and Wound dressing adhesive   Social History: Social History   Socioeconomic History   Marital status: Single    Spouse name: Not on file   Number of children: Not on file   Years of education: Not on file   Highest education level: Not on file  Occupational History   Occupation: Unemployeed  Tobacco Use   Smoking status: Never   Smokeless tobacco: Never  Vaping Use   Vaping Use: Never used  Substance and Sexual Activity   Alcohol use: Never   Drug use: Never   Sexual activity: Not on file  Other Topics Concern   Not on file  Social History Narrative   Not on file   Social Determinants  of Health   Financial Resource Strain: Not on file  Food Insecurity: Not on file  Transportation Needs: Not on file  Physical Activity: Not on file  Stress: Not on file  Social Connections: Not on file     Family History: The patient's family history includes Clotting disorder in her maternal grandmother; Heart disease in her mother; Irritable bowel syndrome in her maternal grandmother; Prostate cancer in her maternal grandfather; Rectal cancer in her maternal grandmother. There is no history of Esophageal cancer or Stomach cancer.  ROS:   All other ROS reviewed and negative. Pertinent positives noted in the HPI.     EKGs/Labs/Other Studies Reviewed:   The following studies were personally reviewed by me today:  EKG:  EKG is ordered today.  The ekg ordered today  demonstrates normal sinus rhythm heart rate 82, no acute ischemic changes or evidence of infarction, and was personally reviewed by me.   Recent Labs: 09/13/2022: ALT 17; BUN 11; Creatinine, Ser 0.64; Hemoglobin 13.5; Platelets 356; Potassium 3.9; Sodium 139 11/16/2022: TSH 1.42   Recent Lipid Panel No results found for: "CHOL", "TRIG", "HDL", "CHOLHDL", "VLDL", "LDLCALC", "LDLDIRECT"  Physical Exam:   VS:  BP 126/84 (BP Location: Left Arm, Patient Position: Sitting, Cuff Size: Normal)   Pulse 82   Ht 5\' 9"  (1.753 m)   Wt 215 lb 6.4 oz (97.7 kg)   SpO2 99%   BMI 31.81 kg/m    Wt Readings from Last 3 Encounters:  01/15/23 215 lb 6.4 oz (97.7 kg)  12/26/22 216 lb (98 kg)  11/16/22 219 lb 4 oz (99.5 kg)    General: Well nourished, well developed, in no acute distress Head: Atraumatic, normal size  Eyes: PEERLA, EOMI  Neck: Supple, no JVD Endocrine: No thryomegaly Cardiac: Normal S1, S2; RRR; no murmurs, rubs, or gallops Lungs: Clear to auscultation bilaterally, no wheezing, rhonchi or rales  Abd: Soft, nontender, no hepatomegaly  Ext: No edema, pulses 2+ Musculoskeletal: No deformities, BUE and BLE strength normal and equal Skin: Warm and dry, no rashes   Neuro: Alert and oriented to person, place, time, and situation, CNII-XII grossly intact, no focal deficits  Psych: Normal mood and affect   ASSESSMENT:   Susan Buchanan is a 22 y.o. female who presents for the following: 1. Precordial pain   2. Palpitations   3. SOB (shortness of breath) on exertion     PLAN:   1. Precordial pain 2. Palpitations 3. SOB (shortness of breath) on exertion -Suspect noncardiac chest pain.  Acid reflux is likely contributing.  I suspect stress and anxiety are also contributing.  She does get short of breath with activity but has no signs of heart failure.  Suspect deconditioning.  She is physically inactive.  Recent TSH was normal at 1.42.  Recent labs show normal serum creatinine and normal  hemoglobin.  She is not anemic.  Suspect her chest pain is just stress related.  This needs no further evaluation.  Given her shortness of breath and palpitations we will obtain an echocardiogram.  This will make sure her heart is structurally normal.  We will also proceed with a 7-day Zio patch to exclude any arrhythmias.  If all of the above is normal suspect this is just related to stress and anxiety.  She will see [place] back as needed based on the results of the above testing.  Disposition: Return if symptoms worsen or fail to improve.  Medication Adjustments/Labs and Tests Ordered: Current medicines are reviewed at  length with the patient today.  Concerns regarding medicines are outlined above.  Orders Placed This Encounter  Procedures   LONG TERM MONITOR (3-14 DAYS)   EKG 12-Lead   ECHOCARDIOGRAM COMPLETE   No orders of the defined types were placed in this encounter.   Patient Instructions  Medication Instructions:  The current medical regimen is effective;  continue present plan and medications.  *If you need a refill on your cardiac medications before your next appointment, please call your pharmacy*   Testing/Procedures: Echocardiogram - Your physician has requested that you have an echocardiogram. Echocardiography is a painless test that uses sound waves to create images of your heart. It provides your doctor with information about the size and shape of your heart and how well your heart's chambers and valves are working. This procedure takes approximately one hour. There are no restrictions for this procedure.   ZIO XT- Long Term Monitor Instructions  Your physician has requested you wear a ZIO patch monitor for 7 days.  This is a single patch monitor. [employer] supplies one patch monitor per enrollment. Additional stickers are not available. Please do not apply patch if you will be having a Nuclear Stress Test,  Echocardiogram, Cardiac CT, MRI, or Chest Xray during the period  you would be wearing the  monitor. The patch cannot be worn during these tests. You cannot remove and re-apply the  ZIO XT patch monitor.  Your ZIO patch monitor will be mailed 3 day USPS to your address on file. It may take 3-5 days  to receive your monitor after you have been enrolled.  Once you have received your monitor, please review the enclosed instructions. Your monitor  has already been registered assigning a specific monitor serial # to you.  Billing and Patient Assistance Program Information  We have supplied [employer] with any of your insurance information on file for billing purposes. [employer] offers a sliding scale Patient Assistance Program for patients that do not have  insurance, or whose insurance does not completely cover the cost of the ZIO monitor.  You must apply for the Patient Assistance Program to qualify for this discounted rate.  To apply, please call [employer] at [phone], select option 4, select option 2, ask to apply for  Patient Assistance Program. Theodore Demark will ask your household income, and how many people  are in your household. They will quote your out-of-pocket cost based on that information.  [employer] will also be able to set up a 12-month, interest-free payment plan if needed.  Applying the monitor   Shave hair from upper left chest.  Hold abrader disc by orange tab. Rub abrader in 40 strokes over the upper left chest as  indicated in your monitor instructions.  Clean area with 4 enclosed alcohol pads. Let dry.  Apply patch as indicated in monitor instructions. Patch will be placed under collarbone on left  side of chest with arrow pointing upward.  Rub patch adhesive wings for 2 minutes. Remove white label marked "1". Remove the white  label marked "2". Rub patch adhesive wings for 2 additional minutes.  While looking in a mirror, press and release button in center of patch. A small green light will  flash 3-4 times. This will be your only indicator  that the monitor has been turned on.  Do not shower for the first 24 hours. You may shower after the first 24 hours.  Press the button if you feel a symptom. You will hear a small click. Record  Date, Time and  Symptom in the Patient Logbook.  When you are ready to remove the patch, follow instructions on the last 2 pages of Patient  Logbook. Stick patch monitor onto the last page of Patient Logbook.  Place Patient Logbook in the blue and white box. Use locking tab on box and tape box closed  securely. The blue and white box has prepaid postage on it. Please place it in the mailbox as  soon as possible. Your physician should have your test results approximately 7 days after the  monitor has been mailed back to [facility].  Call [facility] Customer Care at [phone] if you have questions regarding  your ZIO XT patch monitor. Call them immediately if you see an orange light blinking on your  monitor.  If your monitor falls off in less than 4 days, contact our Monitor department at [phone].  If your monitor becomes loose or falls off after 4 days call [employer] at [phone] for  suggestions on securing your monitor    Follow-Up: At [facility], you and your health needs are our priority.  As part of our continuing mission to provide you with exceptional heart care, we have created designated Provider Care Teams.  These Care Teams include your primary Cardiologist (physician) and Advanced Practice Providers (APPs -  Physician Assistants and Nurse Practitioners) who all work together to provide you with the care you need, when you need it.  We recommend signing up for the patient portal called "MyChart".  Sign up information is provided on this After Visit Summary.  MyChart is used to connect with patients for Virtual Visits (Telemedicine).  Patients are able to view lab/test results, encounter notes, upcoming appointments, etc.  Non-urgent messages can be sent to your  provider as well.   To learn more about what you can do with MyChart, go to [email or web address].    Your next appointment:   As needed  Provider:   Lennie Odor, MD      Signed, Lenna Gilford. Flora Lipps, MD, [facility]  [facility]  [address] [place], [place] [address] [phone]  01/15/2023 4:21 PM

## 2023-01-15 ENCOUNTER — Ambulatory Visit (INDEPENDENT_AMBULATORY_CARE_PROVIDER_SITE_OTHER): Payer: PRIVATE HEALTH INSURANCE

## 2023-01-15 ENCOUNTER — Encounter: Payer: Self-pay | Admitting: Cardiovascular Disease

## 2023-01-15 ENCOUNTER — Ambulatory Visit: Payer: PRIVATE HEALTH INSURANCE | Attending: Cardiovascular Disease | Admitting: Cardiovascular Disease

## 2023-01-15 VITALS — BP 126/84 | HR 82 | Ht 69.0 in | Wt 215.4 lb

## 2023-01-15 DIAGNOSIS — R002 Palpitations: Secondary | ICD-10-CM

## 2023-01-15 DIAGNOSIS — R072 Precordial pain: Secondary | ICD-10-CM | POA: Diagnosis not present

## 2023-01-15 DIAGNOSIS — R0602 Shortness of breath: Secondary | ICD-10-CM

## 2023-01-15 NOTE — Progress Notes (Unsigned)
Enrolled for [employer] to mail a ZIO XT long term holter monitor to the patients address on file.  

## 2023-01-15 NOTE — Patient Instructions (Signed)
Medication Instructions:  The current medical regimen is effective;  continue present plan and medications.  *If you need a refill on your cardiac medications before your next appointment, please call your pharmacy*   Testing/Procedures: Echocardiogram - Your physician has requested that you have an echocardiogram. Echocardiography is a painless test that uses sound waves to create images of your heart. It provides your doctor with information about the size and shape of your heart and how well your heart's chambers and valves are working. This procedure takes approximately one hour. There are no restrictions for this procedure.   ZIO XT- Long Term Monitor Instructions  Your physician has requested you wear a ZIO patch monitor for 7 days.  This is a single patch monitor. [employer] supplies one patch monitor per enrollment. Additional stickers are not available. Please do not apply patch if you will be having a Nuclear Stress Test,  Echocardiogram, Cardiac CT, MRI, or Chest Xray during the period you would be wearing the  monitor. The patch cannot be worn during these tests. You cannot remove and re-apply the  ZIO XT patch monitor.  Your ZIO patch monitor will be mailed 3 day USPS to your address on file. It may take 3-5 days  to receive your monitor after you have been enrolled.  Once you have received your monitor, please review the enclosed instructions. Your monitor  has already been registered assigning a specific monitor serial # to you.  Billing and Patient Assistance Program Information  We have supplied [employer] with any of your insurance information on file for billing purposes. [employer] offers a sliding scale Patient Assistance Program for patients that do not have  insurance, or whose insurance does not completely cover the cost of the ZIO monitor.  You must apply for the Patient Assistance Program to qualify for this discounted rate.  To apply, please call [employer] at [phone],  select option 4, select option 2, ask to apply for  Patient Assistance Program. Theodore Demark will ask your household income, and how many people  are in your household. They will quote your out-of-pocket cost based on that information.  [employer] will also be able to set up a 72-month interest-free payment plan if needed.  Applying the monitor   Shave hair from upper left chest.  Hold abrader disc by orange tab. Rub abrader in 40 strokes over the upper left chest as  indicated in your monitor instructions.  Clean area with 4 enclosed alcohol pads. Let dry.  Apply patch as indicated in monitor instructions. Patch will be placed under collarbone on left  side of chest with arrow pointing upward.  Rub patch adhesive wings for 2 minutes. Remove white label marked "1". Remove the white  label marked "2". Rub patch adhesive wings for 2 additional minutes.  While looking in a mirror, press and release button in center of patch. A small green light will  flash 3-4 times. This will be your only indicator that the monitor has been turned on.  Do not shower for the first 24 hours. You may shower after the first 24 hours.  Press the button if you feel a symptom. You will hear a small click. Record Date, Time and  Symptom in the Patient Logbook.  When you are ready to remove the patch, follow instructions on the last 2 pages of Patient  Logbook. Stick patch monitor onto the last page of Patient Logbook.  Place Patient Logbook in the blue and white box. Use locking tab on  box and tape box closed  securely. The blue and white box has prepaid postage on it. Please place it in the mailbox as  soon as possible. Your physician should have your test results approximately 7 days after the  monitor has been mailed back to [facility].  Call [place] at [phone] if you have questions regarding  your ZIO XT patch monitor. Call them immediately if you see an orange light blinking on your   monitor.  If your monitor falls off in less than 4 days, contact our Monitor department at [phone].  If your monitor becomes loose or falls off after 4 days call [employer] at [phone] for  suggestions on securing your monitor    Follow-Up: At [facility], you and your health needs are our priority.  As part of our continuing mission to provide you with exceptional heart care, we have created designated Provider Care Teams.  These Care Teams include your primary Cardiologist (physician) and Advanced Practice Providers (APPs -  Physician Assistants and Nurse Practitioners) who all work together to provide you with the care you need, when you need it.  We recommend signing up for the patient portal called "MyChart".  Sign up information is provided on this After Visit Summary.  MyChart is used to connect with patients for Virtual Visits (Telemedicine).  Patients are able to view lab/test results, encounter notes, upcoming appointments, etc.  Non-urgent messages can be sent to your provider as well.   To learn more about what you can do with MyChart, go to [email or web address].    Your next appointment:   As needed  Provider:   Eleonore Chiquito, MD

## 2023-01-18 DIAGNOSIS — R002 Palpitations: Secondary | ICD-10-CM | POA: Diagnosis not present

## 2023-01-24 ENCOUNTER — Ambulatory Visit (HOSPITAL_COMMUNITY): Payer: PRIVATE HEALTH INSURANCE | Attending: Cardiovascular Disease

## 2023-01-24 ENCOUNTER — Encounter: Payer: Self-pay | Admitting: Internal Medicine

## 2023-01-24 DIAGNOSIS — R072 Precordial pain: Secondary | ICD-10-CM | POA: Diagnosis present

## 2023-01-24 LAB — ECHOCARDIOGRAM COMPLETE
Area-P 1/2: 3.48 cm2
S' Lateral: 3.1 cm

## 2023-02-18 ENCOUNTER — Other Ambulatory Visit: Payer: Self-pay | Admitting: Internal Medicine

## 2023-03-15 ENCOUNTER — Telehealth: Payer: Self-pay | Admitting: Internal Medicine

## 2023-03-15 MED ORDER — PANTOPRAZOLE SODIUM 40 MG PO TBEC: 40.0000 mg | DELAYED_RELEASE_TABLET | Freq: Every day | ORAL | 0 refills | Status: DC

## 2023-03-15 NOTE — Telephone Encounter (Signed)
Rx for pantoprazole sent to [facility] on [address] which is near [place].

## 2023-03-15 NOTE — Telephone Encounter (Signed)
PT is calling to get a refill for pantoprazole. It should be sent to [facility] on [address]

## 2023-04-06 NOTE — Telephone Encounter (Signed)
Inbound call from patient, stated she was never able to receive the medication as it was supposed to be sent to [facility] on [address]. Would like medication sent there.

## 2023-04-09 ENCOUNTER — Other Ambulatory Visit: Payer: Self-pay

## 2023-04-09 MED ORDER — PANTOPRAZOLE SODIUM 40 MG PO TBEC: 40.0000 mg | DELAYED_RELEASE_TABLET | Freq: Every day | ORAL | 0 refills | Status: DC

## 2023-04-09 NOTE — Telephone Encounter (Signed)
Rx for pantoprazole sent to [facility] on [address] patient is aware and verbalized understanding

## 2023-04-11 ENCOUNTER — Ambulatory Visit: Payer: PRIVATE HEALTH INSURANCE | Admitting: Internal Medicine

## 2023-07-09 ENCOUNTER — Ambulatory Visit: Payer: PRIVATE HEALTH INSURANCE | Admitting: Internal Medicine

## 2023-11-07 NOTE — Progress Notes (Signed)
11/09/2023 Susan Buchanan [id number] 2001-01-03  Referring provider: No ref. provider found Primary GI doctor: Dr. Leonides Schanz  ASSESSMENT AND PLAN:   22 year old female with history of anxiety, depression, IBS presents with nausea, abdominal discomfort, reflux not on PPI, on NSAIDs due to dysmenorrhea Patient has no weight loss, no anemia.  Previously negative CRP, thyroid, celiac. Will recheck CBC, c-Met, sed rate since has been over a year Most likely this represents IBS with increasing anxiety, and consistent BMs , discussed potential endoscopic evaluation if symptoms do not improve.  Patient's mom is going through some changes at work and will prefer anything to happen next year if it needed to. Will refill pantoprazole and dicyclomine, given good Rx card to help decrease cost. Information given about IBS, IBgard samples given Stop NSAIDs Consider evaluation for endometriosis with dysmenorrhea Follow-up 2 to 3 months   Patient Care Team: Patient, No Pcp Per as PCP - General (General Practice)  HISTORY OF PRESENT ILLNESS: 22 y.o. female with a past medical history of anxiety, depression, IBS mixed, hemorrhoids, GERD and others listed below presents for evaluation of IBS.   11/16/2022 CRP negative, normal thyroid negative celiac negative H. pylori IgG 12/26/2022 office visit with Dr. Leonides Schanz for chest pressure.  Palpitations and shortness of breath. PPI switched from pantoprazole and increased to 40 mg daily, suggested cardiologist and therapist.   Continue dicyclomine 4 times daily. Cardiac work up was negative.   Mom Susan Buchanan is here.  She has nausea daily, worse after a bowel movement.  Nausea is worse after food, no particular food but mom states she is very lactose intolerant, she has severe diarrhea.  She can have reflux into your mouth but no vomiting.  She was switched omeprazole 20 mg to pantoprazole 40 mg in Jan, but she never picked up the medication. She has not been on  any PPI in the last year. She has not been on any other counter medication.  She has intermittent dysphagia, worse with breads, not daily, never with liquids.  She states she has horrible insomnia but she has rare GERd symptoms at night.  Has AB pain epigastric to periumblical, feels squeezing sensation, consistent, can be worse after BM.  She has inconsistent Bm's, can have constipation for a week and diarrhea for 3 weeks straight, can be 2-4 x a day.  Has had some BRB on TP and in toliet, can have some rectal discomfort, can be sharp with every BM.  Mom states family history of fissures/hemorrhoids.  Mom says she has had weight loss, has had 2 lbs from our scales, has been not eating well due to AB pain/nausea due to eating.  States she "lives" on rice.  She has night sweats.   She reports NSAID use, ibuprofen use as needed, taking every 6 hours when she has menstraual cramping. She has severe menstrual cramps. She was on 2 advil, 1 tylenol and midol cramping every 6 hours.  She denies ETOH use.   She denies tobacco use.  She denies drug use.   She denies blood thinner use.   Wt Readings from Last 5 Encounters:  11/09/23 214 lb (97.1 kg)  01/15/23 215 lb 6.4 oz (97.7 kg)  12/26/22 216 lb (98 kg)  11/16/22 219 lb 4 oz (99.5 kg)  09/13/22 200 lb (90.7 kg)    She  reports that she has never smoked. She has never used smokeless tobacco. She reports that she does not drink alcohol and does not use  drugs.  RELEVANT LABS AND IMAGING:  Results          CBC    Component Value Date/Time   WBC 10.3 09/13/2022 2017   RBC 4.55 09/13/2022 2017   HGB 13.5 09/13/2022 2017   HCT 39.6 09/13/2022 2017   PLT 356 09/13/2022 2017   MCV 87.0 09/13/2022 2017   MCH 29.7 09/13/2022 2017   MCHC 34.1 09/13/2022 2017   RDW 11.9 09/13/2022 2017   LYMPHSABS 2.2 09/13/2022 2017   MONOABS 0.5 09/13/2022 2017   EOSABS 0.0 09/13/2022 2017   BASOSABS 0.0 09/13/2022 2017   No results for input(s):  "HGB" in the last 8760 hours.  CMP     Component Value Date/Time   NA 139 09/13/2022 2017   K 3.9 09/13/2022 2017   CL 106 09/13/2022 2017   CO2 23 09/13/2022 2017   GLUCOSE 86 09/13/2022 2017   BUN 11 09/13/2022 2017   CREATININE 0.64 09/13/2022 2017   CALCIUM 9.9 09/13/2022 2017   PROT 8.2 (H) 09/13/2022 2017   ALBUMIN 4.4 09/13/2022 2017   AST 17 09/13/2022 2017   ALT 17 09/13/2022 2017   ALKPHOS 65 09/13/2022 2017   BILITOT 0.6 09/13/2022 2017   GFRNONAA >60 09/13/2022 2017      Latest Ref Rng & Units 09/13/2022    8:17 PM  Hepatic Function  Total Protein 6.5 - 8.1 g/dL 8.2   Albumin 3.5 - 5.0 g/dL 4.4   AST 15 - 41 U/L 17   ALT 0 - 44 U/L 17   Alk Phosphatase 38 - 126 U/L 65   Total Bilirubin 0.3 - 1.2 mg/dL 0.6       Current Medications:      Current Outpatient Medications (Analgesics):    Acetaminophen (MIDOL PO), Take by mouth. Store brand  takes as needed for menstrual cramps   Acetaminophen (TYLENOL PO), Take 1 tablet by mouth as needed.   IBUPROFEN PO, Take 1 tablet by mouth as needed.   Current Outpatient Medications (Other):    dicyclomine (BENTYL) 10 MG capsule, Take 1 capsule (10 mg total) by mouth 3 (three) times daily as needed for spasms.   pantoprazole (PROTONIX) 40 MG tablet, Take 1 tablet (40 mg total) by mouth daily.  Medical History:  Past Medical History:  Diagnosis Date   Anxiety    Depression    IBS (irritable bowel syndrome)    UTI (urinary tract infection)    Allergies:  Allergies  Allergen Reactions   Latex Other (See Comments)    Allergic to bandaids   Wound Dressing Adhesive Rash     Surgical History:  She  has a past surgical history that includes Mouth surgery. Family History:  Her family history includes Clotting disorder in her maternal grandmother; Heart disease in her mother; Irritable bowel syndrome in her maternal grandmother; Prostate cancer in her maternal grandfather; Rectal cancer in her maternal  grandmother.  REVIEW OF SYSTEMS  : All other systems reviewed and negative except where noted in the History of Present Illness.  PHYSICAL EXAM: BP 110/70   Pulse 71   Ht 5\' 7"  (1.702 m)   Wt 214 lb (97.1 kg)   BMI 33.52 kg/m  General Appearance: Well nourished, in no apparent distress. Head:   Normocephalic and atraumatic. Eyes:  sclerae anicteric,conjunctive pink  Respiratory: Respiratory effort normal, BS equal bilaterally without rales, rhonchi, wheezing. Cardio: RRR with no MRGs. Peripheral pulses intact.  Abdomen: Soft,  Non-distended ,active bowel sounds. mild  tenderness in the entire abdomen. Without guarding and Without rebound. No masses. Rectal: Not evaluated Musculoskeletal: Full ROM, Normal gait. Without edema. Skin:  Dry and intact without significant lesions or rashes Neuro: Alert and  oriented x4;  No focal deficits. Psych:  Cooperative. Normal mood and affect.    Doree Albee, PA-C 12:52 PM

## 2023-11-09 ENCOUNTER — Other Ambulatory Visit: Payer: No Typology Code available for payment source

## 2023-11-09 ENCOUNTER — Encounter: Payer: Self-pay | Admitting: Physician Assistant

## 2023-11-09 ENCOUNTER — Ambulatory Visit (INDEPENDENT_AMBULATORY_CARE_PROVIDER_SITE_OTHER): Payer: No Typology Code available for payment source | Admitting: Physician Assistant

## 2023-11-09 VITALS — BP 110/70 | HR 71 | Ht 67.0 in | Wt 214.0 lb

## 2023-11-09 DIAGNOSIS — K219 Gastro-esophageal reflux disease without esophagitis: Secondary | ICD-10-CM | POA: Diagnosis not present

## 2023-11-09 DIAGNOSIS — R198 Other specified symptoms and signs involving the digestive system and abdomen: Secondary | ICD-10-CM | POA: Diagnosis not present

## 2023-11-09 DIAGNOSIS — R1030 Lower abdominal pain, unspecified: Secondary | ICD-10-CM | POA: Diagnosis not present

## 2023-11-09 LAB — CBC WITH DIFFERENTIAL/PLATELET
Basophils Absolute: 0 10*3/uL (ref 0.0–0.1)
Basophils Relative: 0.5 % (ref 0.0–3.0)
Eosinophils Absolute: 0 10*3/uL (ref 0.0–0.7)
Eosinophils Relative: 0.5 % (ref 0.0–5.0)
HCT: 38.7 % (ref 36.0–46.0)
Hemoglobin: 12.9 g/dL (ref 12.0–15.0)
Lymphocytes Relative: 27.9 % (ref 12.0–46.0)
Lymphs Abs: 2.1 10*3/uL (ref 0.7–4.0)
MCHC: 33.4 g/dL (ref 30.0–36.0)
MCV: 89.4 fL (ref 78.0–100.0)
Monocytes Absolute: 0.5 10*3/uL (ref 0.1–1.0)
Monocytes Relative: 6.2 % (ref 3.0–12.0)
Neutro Abs: 4.8 10*3/uL (ref 1.4–7.7)
Neutrophils Relative %: 64.9 % (ref 43.0–77.0)
Platelets: 340 10*3/uL (ref 150.0–400.0)
RBC: 4.33 Mil/uL (ref 3.87–5.11)
RDW: 12.7 % (ref 11.5–15.5)
WBC: 7.4 10*3/uL (ref 4.0–10.5)

## 2023-11-09 LAB — SEDIMENTATION RATE: Sed Rate: 17 mm/h (ref 0–20)

## 2023-11-09 LAB — COMPREHENSIVE METABOLIC PANEL
ALT: 11 U/L (ref 0–35)
AST: 13 U/L (ref 0–37)
Albumin: 4.4 g/dL (ref 3.5–5.2)
Alkaline Phosphatase: 61 U/L (ref 39–117)
BUN: 11 mg/dL (ref 6–23)
CO2: 26 meq/L (ref 19–32)
Calcium: 9.2 mg/dL (ref 8.4–10.5)
Chloride: 104 meq/L (ref 96–112)
Creatinine, Ser: 0.62 mg/dL (ref 0.40–1.20)
GFR: 127.27 mL/min (ref >60.00)
Glucose, Bld: 89 mg/dL (ref 70–99)
Potassium: 3.6 meq/L (ref 3.5–5.1)
Sodium: 138 meq/L (ref 135–145)
Total Bilirubin: 0.4 mg/dL (ref 0.2–1.2)
Total Protein: 7.4 g/dL (ref 6.0–8.3)

## 2023-11-09 MED ORDER — DICYCLOMINE HCL 10 MG PO CAPS: 10.0000 mg | ORAL_CAPSULE | Freq: Three times a day (TID) | ORAL | 2 refills | Status: DC | PRN

## 2023-11-09 MED ORDER — PANTOPRAZOLE SODIUM 40 MG PO TBEC: 40.0000 mg | DELAYED_RELEASE_TABLET | Freq: Every day | ORAL | 2 refills | Status: DC

## 2023-11-09 NOTE — Patient Instructions (Addendum)
Your provider has requested that you go to the basement level for lab work before leaving today. Press "B" on the elevator. The lab is located at the first door on the left as you exit the elevator.  Follow up in 3 months   Please take your proton pump inhibitor medication, pantoprazole 40 mg daily Please take this medication 30 minutes to 1 hour before meals- this makes it more effective.  Avoid spicy and acidic foods Avoid fatty foods Limit your intake of coffee, tea, alcohol, and carbonated drinks Work to maintain a healthy weight Keep the head of the bed elevated at least 3 inches with blocks or a wedge pillow if you are having any nighttime symptoms Stay upright for 2 hours after eating Avoid meals and snacks three to four hours before bedtime  First do a trial off milk/lactose products if you use them.  Add fiber like benefiber or citracel once a day Increase activity Can do trial of IBGard which is over the counter for AB pain- Take 1-2 capsules once a day for maintence or twice a day during a flare Can send in an anti spasm medication, Bentyl, to take as needed Please try to decrease stress. consider talking with PCP about anti anxiety medication or try head space app for meditation. if any worsening symptoms like blood in stool, weight loss, please call the office      FODMAP stands for fermentable oligo-, di-, mono-saccharides and polyols (1). These are the scientific terms used to classify groups of carbs that are difficult for our body to digest and that are notorious for triggering digestive symptoms like bloating, gas, loose stools and stomach pain.   You can try low FODMAP diet  - start with eliminating just one column at a time that you feel may be a trigger for you. - the table at the very bottom contains foods that are low in FODMAPs   Sometimes trying to eliminate the FODMAP's from your diet is difficult or tricky, if you are stuggling with trying to do the  elimination diet you can try an enzyme.  There is a food enzymes that you sprinkle in or on your food that helps break down the FODMAP. You can read more about the enzyme by going to this site: https://fodzyme.com/   We may want to evaluate you for small intestinal bacterial overgrowth, this can cause increase gas, bloating, loose stools or constipation.  There is a test for this we can do or sometimes we will treat a patient with an antibiotic to see if it helps.   Endometriosis  Endometriosis is a condition in which tissue that forms the lining of the uterus grows in places outside the uterus. This tissue can grow in the organs that create the eggs (ovaries), in the tubes that carry the eggs to the uterus (fallopian tubes), in the vagina, and in the bowel. This tissue most often grows on the ovaries and inner lining of the pelvic cavity (peritoneum). What are the causes? The cause of this condition is not known. What increases the risk? The following factors may make you more likely to develop this condition: Having a family history of endometriosis. Having never given birth. Starting your menstrual period at age 10 or younger. What are the signs or symptoms? Often, there are no symptoms of this condition. If you do have symptoms, they may include: Heavier bleeding during menstrual periods. Menstrual periods that happen more than once a month. Not being able to  get pregnant. Pain in the area between your hip bones (pelvis). Pain during sex. Pain in the back or abdomen. Painful bowel movements and urination during menstrual periods. Rarely, you may see blood in your stool or urine. The timing of symptoms may vary, depending on where the abnormal tissue is growing. They may happen during your menstrual period (most often) or at the middle of your cycle. They may come and go. You may have no symptoms during some months. They may stop when you no longer have your monthly periods  (menopause). How is this diagnosed? This condition is diagnosed based on your symptoms and a physical exam. You may also have tests, such as: Blood tests and urine tests to help rule out other causes. Ultrasound to look for tissues that are not normal. This is often done over your skin (transabdominal). It is sometimes done through the vagina (transvaginal). X-ray of the lower bowel (barium enema). CT scan. MRI. To confirm the diagnosis, your health care provider may use a device with a small camera to check tissue inside your abdomen (laparoscopy). Abnormal tissue may be removed and checked in a lab (biopsy). How is this treated? There is no cure for this condition. The treatment goal is to control your symptoms. The type of treatment also depends on whether you want to become pregnant in the future. This condition may be treated with: Medicines. These may include: Medicines to relieve pain, including NSAIDs, such as ibuprofen. Hormone therapy, such as birth control pills, to slow the growth of abnormal tissue. Surgery to remove the abnormal tissue. During surgery, the following may happen: Tissue may be removed using a laparoscope and a laser (laparoscopic laser treatment). The ovaries, fallopian tubes, and uterus may be removed (hysterectomy). This is done in very severe cases. Follow these instructions at home: Medicines Take over-the-counter and prescription medicines only as told by your health care provider. Ask your health care provider if the medicine prescribed to you: Requires you to avoid driving or using machinery. Can cause constipation. You may need to take these actions to prevent or treat constipation: Drink enough fluid to keep your urine pale yellow. Take over-the-counter or prescription medicines. Eat foods that are high in fiber, such as beans, whole grains, and fresh fruits and vegetables. Limit foods that are high in fat and processed sugars, such as fried or sweet  foods. Eating and drinking If you drink alcohol: Limit how much you have to 0-1 drink a day for women who are not pregnant. Know how much alcohol is in your drink. In the [place] one drink equals one 12 oz bottle of beer (355 mL), one 5 oz glass of wine (148 mL), or one 1 oz glass of hard liquor (44 mL). Avoid caffeine. Activity Return to your normal activities as told by your health care provider. Ask your health care provider what activities are safe for you. Do exercises as told by your health care provider. General instructions Do not use any products that contain nicotine or tobacco. These products include cigarettes, chewing tobacco, and vaping devices, such as e-cigarettes. If you need help quitting, ask your health care provider. Keep all follow-up visits. This is important. Where to find more information [employer]: www.acog.org Office on [employer] [facility]: http://hoffman.com/ Contact a health care provider if: You have new pain or trouble controlling pain. You have problems getting pregnant. You have a fever. Get help right away if: You have severe pain that does not get  better with medicine. You have severe nausea and vomiting, or you cannot eat or drink without vomiting. You have pain in your abdomen only on the lower right side. Pain in your abdomen gets worse. You have swelling in your abdomen. You have blood in your stool. Summary Endometriosis is a condition that happens when tissue that forms the lining of the uterus grows in places outside the uterus. The cause of this condition is not known. This condition may be treated with medicines to relieve pain, hormone therapy, or surgery. If you have this condition, get regular exercise, limit alcohol use, and avoid caffeine. Get help right away if you have severe pain that does not get better with medicine, severe nausea and vomiting, pain or swelling in your abdomen, or blood in your  stool. This information is not intended to replace advice given to you by your health care provider. Make sure you discuss any questions you have with your health care provider. Document Revised: 07/06/2020 Document Reviewed: 07/07/2020 Elsevier Patient Education  2024 [employer].

## 2023-11-09 NOTE — Progress Notes (Signed)
I agree with the assessment and plan as outlined by Ms. Collier. 

## 2024-07-25 ENCOUNTER — Other Ambulatory Visit: Payer: Self-pay | Admitting: Physician Assistant

## 2024-10-15 ENCOUNTER — Telehealth: Payer: Self-pay | Admitting: Physician Assistant

## 2024-10-15 MED ORDER — DICYCLOMINE HCL 10 MG PO CAPS: 10.0000 mg | ORAL_CAPSULE | Freq: Three times a day (TID) | ORAL | 1 refills | Status: DC | PRN

## 2024-10-15 NOTE — Telephone Encounter (Signed)
 Inbound call from patient stating she needs refill on medication dicyclomine . Please advise  Thank you

## 2024-10-15 NOTE — Telephone Encounter (Signed)
 Is it ok to refill or does she need an office visit?

## 2024-10-15 NOTE — Telephone Encounter (Signed)
 Put it on the script

## 2024-10-15 NOTE — Addendum Note (Signed)
 Addended by: KATHIE BOTTCHER E on: 10/15/2024 04:54 PM   Modules accepted: Orders

## 2024-10-24 ENCOUNTER — Other Ambulatory Visit: Payer: Self-pay | Admitting: Physician Assistant

## 2024-12-01 ENCOUNTER — Telehealth: Payer: Self-pay | Admitting: Physician Assistant

## 2024-12-01 NOTE — Telephone Encounter (Signed)
 Inbound call from patient stating that she is needing a referral for her to see an ENT. Patient stated that she has lost about 4 cups of blood due to a nose bleed and was trying to get it looked at but was advised that she would need a referral. Patient is requesting a call back. Please advise.

## 2024-12-02 ENCOUNTER — Ambulatory Visit
Admission: EM | Admit: 2024-12-02 | Discharge: 2024-12-02 | Disposition: A | Discharge status: Home / Self Care | Payer: Self-pay | Attending: Family Medicine | Admitting: Family Medicine

## 2024-12-02 DIAGNOSIS — R040 Epistaxis: Secondary | ICD-10-CM

## 2024-12-02 NOTE — ED Triage Notes (Signed)
 Pt present nosebleed, nonstop, symptoms started two weeks ago.

## 2024-12-02 NOTE — ED Provider Notes (Signed)
 [facility]    CSN: [id number] Arrival date & time: 12/02/24  1642      History   Chief Complaint Chief Complaint  Patient presents with   Epistaxis    HPI Susan Buchanan is a 23 y.o. female.    Epistaxis  Here for bleeding from her left nostril for the last 2 weeks.  Is been pretty persistent and sometimes has gotten worse than others.  At one time it bled for about 4 hours constantly.  She has had nosebleeds in the past though not this persistent.  NKDA  Last menstrual cycle was December 9.  She does not have a primary care provider  Past Medical History:  Diagnosis Date   Anxiety    Depression    IBS (irritable bowel syndrome)    UTI (urinary tract infection)     There are no active problems to display for this patient.   Past Surgical History:  Procedure Laterality Date   MOUTH SURGERY     2021. Due to a baby tooth that didnt come out and an abcess    OB History   No obstetric history on file.      Home Medications    Prior to Admission medications  Medication Sig Start Date End Date Taking? Authorizing Provider  Acetaminophen (MIDOL PO) Take by mouth. Store brand  takes as needed for menstrual cramps    [provider]  Acetaminophen (TYLENOL PO) Take 1 tablet by mouth as needed.    [provider]  dicyclomine  (BENTYL ) 10 MG capsule Take 1 capsule (10 mg total) by mouth 3 (three) times daily as needed for spasms. Please call [phone] to schedule an office visit for more refills. No more refills will be given unless an appointment is made 10/15/24   Craig Alan SAUNDERS, PA-C  IBUPROFEN PO Take 1 tablet by mouth as needed.    [provider]  pantoprazole  (PROTONIX ) 40 MG tablet Take 1 tablet by mouth once daily 10/24/24   Craig Alan SAUNDERS, PA-C    Family History Family History  Problem Relation Age of Onset   Heart disease Mother    Rectal cancer Maternal Grandmother    Clotting disorder Maternal  Grandmother    Irritable bowel syndrome Maternal Grandmother    Prostate cancer Maternal Grandfather    Esophageal cancer Neg Hx    Stomach cancer Neg Hx     Social History Social History[1]   Allergies   Latex and Wound dressing adhesive   Review of Systems Review of Systems  HENT:  Positive for nosebleeds.      Physical Exam Triage Vital Signs ED Triage Vitals  Encounter Vitals Group     BP 12/02/24 1748 124/79     Girls Systolic BP Percentile --      Girls Diastolic BP Percentile --      Boys Systolic BP Percentile --      Boys Diastolic BP Percentile --      Pulse Rate 12/02/24 1748 88     Resp 12/02/24 1748 16     Temp 12/02/24 1748 (!) 97.3 F (36.3 C)     Temp Source 12/02/24 1748 Oral     SpO2 12/02/24 1748 97 %     Weight --      Height --      Head Circumference --      Peak Flow --      Pain Score 12/02/24 1747 0     Pain Loc --  Pain Education --      Exclude from Growth Chart --    No data found.  Updated Vital Signs BP 124/79 (BP Location: Left Arm)   Pulse 88   Temp (!) 97.3 F (36.3 C) (Oral)   Resp 16   LMP 11/25/2024   SpO2 97%   Visual Acuity Right Eye Distance:   Left Eye Distance:   Bilateral Distance:    Right Eye Near:   Left Eye Near:    Bilateral Near:     Physical Exam Vitals reviewed.  Constitutional:      General: She is not in acute distress.    Appearance: She is not ill-appearing, toxic-appearing or diaphoretic.  HENT:     Nose:     Comments: There is little bit of blood on the medial surface of the left nostril when I look initially.    Mouth/Throat:     Comments: Oropharynx is benign Eyes:     Extraocular Movements: Extraocular movements intact.     Pupils: Pupils are equal, round, and reactive to light.  Cardiovascular:     Rate and Rhythm: Normal rate and regular rhythm.  Skin:    Coloration: Skin is not pale.  Neurological:     General: No focal deficit present.     Mental Status: She is alert  and oriented to person, place, and time.  Psychiatric:        Behavior: Behavior normal.      UC Treatments / Results  Labs (all labs ordered are listed, but only abnormal results are displayed) Labs Reviewed  CBC    EKG   Radiology No results found.  Procedures Procedures (including critical care time)  Medications Ordered in UC Medications - No data to display  Initial Impression / Assessment and Plan / UC Course  I have reviewed the triage vital signs and the nursing notes.  Pertinent labs & imaging results that were available during my care of the patient were reviewed by me and considered in my medical decision making (see chart for details).     2 sprays of Afrin are administered and she pinched her nose for about 15 minutes to apply pressure.  After that there is no bleeding site noted.  Referral was created for ENT, though I did discuss with her and her mom that we do not have administrative staff to route referrals.  She can call the ENT and speak to them tomorrow.  She is given contact information for them.  CBC is drawn today and staff will notify her if anything is abnormal. Final Clinical Impressions(s) / UC Diagnoses   Final diagnoses:  Epistaxis     Discharge Instructions      We sprayed 2 sprays of Afrin/oxymetazoline in your left nostril.  Applying pressure for about 15 minutes can help the nosebleeds.  You can go to [facility] [email or web address] to make yourself a new primary care appointment  Please call the ENT office tomorrow.  Let them know I made a referral in our computer chart.     ED Prescriptions   None    PDMP not reviewed this encounter.    [1]  Social History Tobacco Use   Smoking status: Never   Smokeless tobacco: Never  Vaping Use   Vaping status: Never Used  Substance Use Topics   Alcohol use: Never   Drug use: Never     Susan Sharlet POUR, MD 12/02/24 [phone]

## 2024-12-02 NOTE — Telephone Encounter (Signed)
 Pt stated that she has had nose bleeds for the last two weeks. Pt stated that two days ago that she has a nose bleed that lasted for 4 hours. Pt requesting a referral to see ENT.  Chart reviewed and noted that pt has not been in our office in over a year.  Pt was notified that the recommendation was to contact her PCP for the referral. Pt stated that she does not have a PCP. Pt was recommended to go to Urgent Care for evaluation and treatment for on going nose bleeds and also the request for the referral to ENT.  Pt verbalized understanding with all questions answered.

## 2024-12-02 NOTE — Discharge Instructions (Signed)
 We sprayed 2 sprays of Afrin/oxymetazoline in your left nostril.  Applying pressure for about 15 minutes can help the nosebleeds.  You can go to [facility] [email or web address] to make yourself a new primary care appointment  Please call the ENT office tomorrow.  Let them know I made a referral in our computer chart.

## 2024-12-02 NOTE — Telephone Encounter (Signed)
 Left message for pt to call back

## 2024-12-02 NOTE — Telephone Encounter (Signed)
 Inbound call from patient who is returning a call back to [place]. Patient is requesting a call back. Please advise.

## 2024-12-03 ENCOUNTER — Ambulatory Visit: Payer: Self-pay | Admitting: Family Medicine

## 2024-12-03 LAB — CBC
Hematocrit: 40.4 % (ref 34.0–46.6)
Hemoglobin: 13.4 g/dL (ref 11.1–15.9)
MCH: 29.3 pg (ref 26.6–33.0)
MCHC: 33.2 g/dL (ref 31.5–35.7)
MCV: 88 fL (ref 79–97)
Platelets: 349 x10E3/uL (ref 150–450)
RBC: 4.57 x10E6/uL (ref 3.77–5.28)
RDW: 12 % (ref 11.7–15.4)
WBC: 7.8 x10E3/uL (ref 3.4–10.8)

## 2024-12-04 ENCOUNTER — Ambulatory Visit (INDEPENDENT_AMBULATORY_CARE_PROVIDER_SITE_OTHER): Payer: Self-pay

## 2024-12-04 ENCOUNTER — Encounter (INDEPENDENT_AMBULATORY_CARE_PROVIDER_SITE_OTHER): Payer: Self-pay

## 2024-12-04 VITALS — BP 136/92 | HR 83

## 2024-12-04 DIAGNOSIS — R040 Epistaxis: Secondary | ICD-10-CM

## 2024-12-04 DIAGNOSIS — J342 Deviated nasal septum: Secondary | ICD-10-CM

## 2024-12-04 MED ORDER — AYR SALINE NASAL NA GEL: 1.0000 | Freq: Every evening | NASAL | 0 refills | Status: AC

## 2024-12-04 NOTE — Patient Instructions (Signed)
 Instructions to help prevent future episodes of nose bleeds and management: - Reduce nasal manipulation - no nose picking, no tissues in the nose other than for dabbing.  - Use Afrin nasal spray for ACTIVE bleeding on the bleeding side and hold firm, continuous nasal pressure (on nostril - soft part of the nose, not the bone)  for 15 mins continuously for ACTIVE bleeding.  - Frequent nasal saline spray (every 4-6 hours as needed) to keep nose moist. - Apply over-the-counter nasal saline GEL (such as Ayr GEL) to the nostrils (use pea sized amount on pinkie finger and put on the nostril - not the middle part of the septum) and then gently pinch the nostril. Do at least 3 times per day but can do it as many times as you want - May use humidifer in bedroom at night. - Maintain tight BP control, as high blood pressure will certainly increase epistaxis occurrences.

## 2024-12-04 NOTE — Progress Notes (Unsigned)
 Dear Dr. Vonna, Here is my assessment for our mutual patient, Susan Buchanan. Thank you for allowing me the opportunity to care for your patient. Please do not hesitate to contact me should you have any other questions. Sincerely, Dr. Hadassah Parody  Otolaryngology Clinic Note Referring provider: Dr. Vonna HPI:   Initial HPI (12/04/2024) Discussed the use of AI scribe software for clinical note transcription with the patient, who gave verbal consent to proceed.  History of Present Illness Susan Buchanan is a 23 year old who presents with recurrent epistaxis. She is accompanied by her mother.   She has had an intermittent episodes of epistaxis throughout her life.  Currently experiencing daily episodes and recent episode occurred 2 to 3 days prior to this visit.  Lasted from noon to 4 5 in the afternoon, less severe than others.  Bleeding often managed with cotton ball or sticking a tissue in the nose during an episode.  Her mother had epistaxis when she was younger and had nasal cautery performed.  He is using Afrin as recommended during urgent care visit to manage bleeding.    Independent Review of Additional Tests or Records:  Hgb 12/02/24 13.4 Hgb 11/09/23 12.9   ED note 12/02/2024 Sharlet Vonna, MD: Patient with recurrent epistaxis from the left side for 2 weeks, refer to ENT  PMH/Meds/All/SocHx/FamHx/ROS:   Past Medical History:  Diagnosis Date   Anxiety    Depression    IBS (irritable bowel syndrome)    UTI (urinary tract infection)      Past Surgical History:  Procedure Laterality Date   MOUTH SURGERY     2021. Due to a baby tooth that didnt come out and an abcess    Family History  Problem Relation Age of Onset   Heart disease Mother    Rectal cancer Maternal Grandmother    Clotting disorder Maternal Grandmother    Irritable bowel syndrome Maternal Grandmother    Prostate cancer Maternal Grandfather    Esophageal cancer Neg Hx    Stomach cancer  Neg Hx      Social Connections: Not on file     Current Outpatient Medications  Medication Instructions   Acetaminophen (MIDOL PO) Take by mouth. Store brand  takes as needed for menstrual cramps   Acetaminophen (TYLENOL PO) 1 tablet, As needed   dicyclomine  (BENTYL ) 10 mg, Oral, 3 times daily PRN, Please call [phone] to schedule an office visit for more refills. No more refills will be given unless an appointment is made   IBUPROFEN PO 1 tablet, As needed   pantoprazole  (PROTONIX ) 40 mg, Oral, Daily   saline (AYR) GEL 1 Application, Each Nare, Nightly     Physical Exam:   BP (!) 136/92 (BP Location: Left Arm, Patient Position: Sitting)   Pulse 83   LMP 11/25/2024   SpO2 98%   Salient findings:  CN II-XII intact Anterior rhinoscopy: Septum slight leftward nasal septal deviation, area of concern anterior left septum  No lesions of oral cavity/oropharynx No obviously palpable neck masses/lymphadenopathy/thyromegaly No respiratory distress or stridor  Seprately Identifiable Procedures:  Prior to initiating any procedures, risks/benefits/alternatives were explained to the patient and verbal consent obtained.  PROCEDURE NOTE (12/04/2024): Preoperative diagnosis: left sided epistaxis Postoperative diagnosis: same  Procedure: control of nasal hemorrhage anterior, simple (CPT 30901) Indication: Epistaxis EBL: 0 mL   Procedure: The patient was identified and properly positioned and verbal consent obtained. Topical lidocaine  and Afrin were applied to the nasal cavity  bilaterally and allowed  to work for 10 minutes. The area over the  left anterior septum which was the suspected source of bleeding was focally cauterized using silver nitrate cautery with help of nasal speculum and headlight. There was no significant bleeding afterwards. Mupirocin ointment was applied over the area. Patient tolerated the procedure well   Impression & Plans:  Susan Buchanan is a 23 y.o. female with    1. Recurrent epistaxis    Assessment and Plan Assessment & Plan Recurrent epistaxis Chronic recurrent epistaxis primarily on the left side, exacerbated by winter conditions. Examination reveals healed mucosa but with some prominent blood vessels that are likely the cause of her symptoms. - Cauterized septum to prevent future episodes. - Prescribed nasal saline gel for nightly use to maintain moisture. - Instructed on use of Afrin during bleeding episodes: spray and hold pressure for five minutes. - Advised against inserting tissues into the nose during bleeding. - Recommended use of a humidifier in the room during winter months.  Deviated nasal septum Mild deviation of the nasal septum contributing to dryness and irritation of the mucosa, leading to recurrent epistaxis.    See below regarding exact medications prescribed this encounter including dosages and route: Meds ordered this encounter  Medications   saline (AYR) GEL    Sig: Place 1 Application into both nostrils at bedtime.    Dispense:  14.1 g    Refill:  0      Thank you for allowing me the opportunity to care for your patient. Please do not hesitate to contact me should you have any other questions.  Sincerely, Hadassah Parody, MD Otolaryngologist (ENT), [facility] Phone: [phone] Fax: [phone]  MDM:  Level 4- Complexity/Problems addressed: 3-acute uncomplicated injury Data complexity: 4-  independent review of 2 labs, 1 note - Morbidity: 4- - Prescription Drug prescribed or managed: yes

## 2024-12-05 ENCOUNTER — Encounter (INDEPENDENT_AMBULATORY_CARE_PROVIDER_SITE_OTHER): Payer: Self-pay

## 2024-12-05 ENCOUNTER — Encounter (HOSPITAL_BASED_OUTPATIENT_CLINIC_OR_DEPARTMENT_OTHER): Payer: Self-pay

## 2024-12-05 ENCOUNTER — Ambulatory Visit (INDEPENDENT_AMBULATORY_CARE_PROVIDER_SITE_OTHER): Payer: Self-pay

## 2024-12-05 VITALS — BP 115/80 | HR 70 | Temp 97.5°F

## 2024-12-05 DIAGNOSIS — R040 Epistaxis: Secondary | ICD-10-CM | POA: Insufficient documentation

## 2024-12-05 NOTE — Progress Notes (Signed)
 Dear Dr. Rexford ref. provider found, Here is my assessment for our mutual patient, Susan Buchanan. Thank you for allowing me the opportunity to care for your patient. Please do not hesitate to contact me should you have any other questions. Sincerely, Dr. Hadassah Parody  Otolaryngology Clinic Note Referring provider: Dr. Rexford ref. provider found HPI:   Initial HPI (12/04/24) Discussed the use of AI scribe software for clinical note transcription with the patient, who gave verbal consent to proceed.  Susan Buchanan is a 23 year old who presents with recurrent epistaxis. She is accompanied by her mother.   She has had an intermittent episodes of epistaxis throughout her life.  Currently experiencing daily episodes and recent episode occurred 2 to 3 days prior to this visit.  Lasted from noon to 4 5 in the afternoon, less severe than others.  Bleeding often managed with cotton ball or sticking a tissue in the nose during an episode.  Her mother had epistaxis when she was younger and had nasal cautery performed.  He is using Afrin as recommended during urgent care visit to manage bleeding.  History of Present Illness --------------------------------------------------------- 12/05/2024  Presents for follow-up today for recurrent epistaxis after cautery was performed yesterday.  She states that 30 minutes after her visit yesterday she started bleeding.  Used Afrin but it was hard to control.  Not currently bleeding.  Independent Review of Additional Tests or Records:  Hgb 12/02/24 13.4 Hgb 11/09/23 12.9   ED note 12/02/2024 Susan Linden, MD: Patient with recurrent epistaxis from the left side for 2 weeks, refer to ENT  PMH/Meds/All/SocHx/FamHx/ROS:   Past Medical History:  Diagnosis Date   Anxiety    Depression    IBS (irritable bowel syndrome)    UTI (urinary tract infection)      Past Surgical History:  Procedure Laterality Date   MOUTH SURGERY     2021. Due to a baby tooth  that didnt come out and an abcess    Family History  Problem Relation Age of Onset   Heart disease Mother    Rectal cancer Maternal Grandmother    Clotting disorder Maternal Grandmother    Irritable bowel syndrome Maternal Grandmother    Prostate cancer Maternal Grandfather    Esophageal cancer Neg Hx    Stomach cancer Neg Hx      Social Connections: Not on file     Current Outpatient Medications  Medication Instructions   Acetaminophen (MIDOL PO) Take by mouth. Store brand  takes as needed for menstrual cramps   Acetaminophen (TYLENOL PO) 1 tablet, As needed   dicyclomine  (BENTYL ) 10 mg, Oral, 3 times daily PRN, Please call [phone] to schedule an office visit for more refills. No more refills will be given unless an appointment is made   IBUPROFEN PO 1 tablet, As needed   pantoprazole  (PROTONIX ) 40 mg, Oral, Daily   saline (AYR) GEL 1 Application, Each Nare, Nightly     Physical Exam:   BP 115/80   Pulse 70   Temp (!) 97.5 F (36.4 C)   LMP 11/25/2024   SpO2 97%   Salient findings:  CN II-XII intact Anterior rhinoscopy: Septum slight leftward nasal septal deviation, area of concern anterior left septum that was cauterized today before.  Unable to visualize the posterior septum or posterior aspect of the nasal cavity.  No lesions of oral cavity/oropharynx No obviously palpable neck masses/lymphadenopathy/thyromegaly No respiratory distress or stridor  Seprately Identifiable Procedures:  Prior to initiating any procedures,  risks/benefits/alternatives were explained to the patient and verbal consent obtained.  PROCEDURE (12/05/2024): Rigid Nasal Endoscopy with endoscopic control of left sided epistaxis (CPT [phone]) Pre-procedure diagnosis: Epistaxis on left  Post-procedure diagnosis: same Indication: See pre-procedure diagnosis and physical exam above Complications: None apparent EBL: 0 mL Anesthesia: Lidocaine  4% and topical decongestant was topically sprayed in  each nasal cavity  Description of Procedure:  Patient was identified as correct patient.  Afrin/lidocaine  mix was sprayed into the nose in nasal cavities.   A headlight was used was only able to visualize the anterior septum.  Given her repeated episode of epistaxis, endoscopy was required to evaluate for any additional sources of bleeding.  Endoscopy was performed to evaluate the source of bleeding and to evaluate if there were any additional sources of bleeding aside from the area cauterized the day prior.  A rigid 0 degree endoscope was utilized to visualize the sinonasal cavities, mucosa, sinus ostia and turbinates and septum.  There were no additional areas of concern,, however the anterior septum on the left side started to bleed spontaneously.  Afrin was applied to this area to help with bleeding cessation but was not successful in completely stopping the bleeding.  Therefore decision was made to perform spot cauterization under the guide of the endoscope.  Silver nitrate cautery was used to perform cauterization.  She continued to bleed through this and additional hemostatic agent Surgiflo was used in the anterior nasal cavity to help with sensation.  Following this, she was hemostatic. Patient tolerated the procedure well.    Impression & Plans:  Susan Buchanan is a 23 y.o. female with   1. Recurrent epistaxis     Assessment and Plan Assessment & Plan Recurrent epistaxis Chronic recurrent epistaxis primarily on the left side, exacerbated by winter conditions. Examination reveals healed mucosa but with some prominent blood vessels that are likely the cause of her symptoms.  Cauterization was performed with a headlight on 12/04/2024.  She presented again today with recurrent epistaxis.  Cauterization was again performed with the help of an endoscope to fully access the area of concern.  Cauterization was performed with silver nitrate as well as hemostatic agent applied with  Surgiflo -Continue using Ayr gel nightly -Use Afrin during episodes spray and hold pressure for 5 minutes - Recommended use of a humidifier in the room during winter months.    See below regarding exact medications prescribed this encounter including dosages and route: No orders of the defined types were placed in this encounter.     Thank you for allowing me the opportunity to care for your patient. Please do not hesitate to contact me should you have any other questions.  Sincerely, Hadassah Parody, MD Otolaryngologist (ENT), [facility] Phone: [phone] Fax: [phone]

## 2024-12-05 NOTE — ED Triage Notes (Signed)
 Reports 2-3 weeks of epistaxis. Seen at ENT today, they cauterized it but the bleeding continued. No active bleeding at this time in triage. When it does bleed it is out of the left nostril.

## 2024-12-06 ENCOUNTER — Emergency Department (HOSPITAL_BASED_OUTPATIENT_CLINIC_OR_DEPARTMENT_OTHER)
Admission: EM | Admit: 2024-12-06 | Discharge: 2024-12-06 | Disposition: A | Payer: Self-pay | Attending: Emergency Medicine | Admitting: Emergency Medicine

## 2024-12-06 DIAGNOSIS — R040 Epistaxis: Secondary | ICD-10-CM

## 2024-12-06 LAB — CBC WITH DIFFERENTIAL/PLATELET
Abs Immature Granulocytes: 0.03 K/uL (ref 0.00–0.07)
Basophils Absolute: 0.1 K/uL (ref 0.0–0.1)
Basophils Relative: 0 %
Eosinophils Absolute: 0 K/uL (ref 0.0–0.5)
Eosinophils Relative: 0 %
HCT: 36.7 % (ref 36.0–46.0)
Hemoglobin: 12.7 g/dL (ref 12.0–15.0)
Immature Granulocytes: 0 %
Lymphocytes Relative: 28 %
Lymphs Abs: 3.2 K/uL (ref 0.7–4.0)
MCH: 29.5 pg (ref 26.0–34.0)
MCHC: 34.6 g/dL (ref 30.0–36.0)
MCV: 85.2 fL (ref 80.0–100.0)
Monocytes Absolute: 0.6 K/uL (ref 0.1–1.0)
Monocytes Relative: 5 %
Neutro Abs: 7.6 K/uL (ref 1.7–7.7)
Neutrophils Relative %: 67 %
Platelets: 356 K/uL (ref 150–400)
RBC: 4.31 MIL/uL (ref 3.87–5.11)
RDW: 12 % (ref 11.5–15.5)
WBC: 11.5 K/uL — ABNORMAL HIGH (ref 4.0–10.5)
nRBC: 0 % (ref 0.0–0.2)

## 2024-12-06 NOTE — Discharge Instructions (Signed)
 While you were in the emergency room, you had blood work done that showed your hemoglobin is 12.7.  This is in line with where your hemoglobin usually is.  Please call your ENT doctor on Monday for follow-up appointment.  Return to the emergency room as needed for more frequent bleeding.

## 2024-12-06 NOTE — ED Provider Notes (Signed)
 " [name] [facility] AT [facility] Provider Note   CSN: [id number] Arrival date & time: 12/05/24  2203     Patient presents with: Epistaxis   Susan Buchanan is a 23 y.o. female.   23 year old female here today with left nares nosebleed.  Patient reportedly is been having nosebleeds over the last 2 to 3 weeks.  Follows with ENT today, who cauterized a vessel and placed packing.  She states that she has had some continued bleeding around, and some down the back of her throat earlier today.  It is since stopped.  She is concerned about blood loss.   Epistaxis      Prior to Admission medications  Medication Sig Start Date End Date Taking? Authorizing Provider  Acetaminophen (MIDOL PO) Take by mouth. Store brand  takes as needed for menstrual cramps    [provider]  Acetaminophen (TYLENOL PO) Take 1 tablet by mouth as needed.    [provider]  dicyclomine  (BENTYL ) 10 MG capsule Take 1 capsule (10 mg total) by mouth 3 (three) times daily as needed for spasms. Please call [phone] to schedule an office visit for more refills. No more refills will be given unless an appointment is made 10/15/24   Craig Alan SAUNDERS, PA-C  IBUPROFEN PO Take 1 tablet by mouth as needed.    [provider]  pantoprazole  (PROTONIX ) 40 MG tablet Take 1 tablet by mouth once daily 10/24/24   Collier, Amanda R, PA-C  saline (AYR) GEL Place 1 Application into both nostrils at bedtime. 12/04/24   Masciello, Hadassah BROCKS, MD    Allergies: Latex and Wound dressing adhesive    Review of Systems  HENT:  Positive for nosebleeds.     Updated Vital Signs BP 100/78   Pulse 83   Temp 98.1 F (36.7 C)   Resp 16   LMP 11/25/2024   SpO2 100%   Physical Exam Vitals and nursing note reviewed.  HENT:     Nose:     Comments: Packing present left nares.  There is dried blood on the outside of the left nares.  There is no active bleeding at this time, no blood in the  posterior oropharynx Cardiovascular:     Rate and Rhythm: Normal rate.  Neurological:     Mental Status: She is alert.     (all labs ordered are listed, but only abnormal results are displayed) Labs Reviewed  CBC WITH DIFFERENTIAL/PLATELET - Abnormal; Notable for the following components:      Result Value   WBC 11.5 (*)    All other components within normal limits    EKG: None  Radiology: No results found.   Procedures   Medications Ordered in the ED - No data to display                                  Medical Decision Making 24 year old female here today for nosebleed.  Plan -I do not see any active bleeding at this time.  Patient had packing placed today by ENT, do not believe it is prudent to remove their packing as it appears to be working at this time.  Will check CBC on the patient.  Reassessment 2:30 AM-patient's hemoglobin is 12.7.  This is in the line of her usual levels.  Will discharge patient.  I reviewed the patient's most recent PCP office note.  This patient's health care is  complicated by the following social determinants of health-lack of access to primary care.  Amount and/or Complexity of Data Reviewed Labs: ordered.        Final diagnoses:  Epistaxis    ED Discharge Orders     None          Mannie Fairy DASEN, DO 12/06/24 0234  "

## 2024-12-24 ENCOUNTER — Encounter (INDEPENDENT_AMBULATORY_CARE_PROVIDER_SITE_OTHER): Payer: Self-pay

## 2024-12-24 ENCOUNTER — Ambulatory Visit (INDEPENDENT_AMBULATORY_CARE_PROVIDER_SITE_OTHER): Payer: Self-pay

## 2024-12-24 VITALS — BP 115/81 | HR 88

## 2024-12-24 DIAGNOSIS — R040 Epistaxis: Secondary | ICD-10-CM

## 2024-12-24 DIAGNOSIS — J3489 Other specified disorders of nose and nasal sinuses: Secondary | ICD-10-CM

## 2024-12-24 MED ORDER — MUPIROCIN 2 % EX OINT: 1.0000 | TOPICAL_OINTMENT | Freq: Two times a day (BID) | CUTANEOUS | 0 refills | Status: AC

## 2024-12-24 NOTE — Progress Notes (Unsigned)
 Dear Dr. Rexford ref. provider found, Here is my assessment for our mutual patient, Chantil Moat. Thank you for allowing me the opportunity to care for your patient. Please do not hesitate to contact me should you have any other questions. Sincerely, Dr. Hadassah Parody  Otolaryngology Clinic Note Referring provider: Dr. Rexford ref. provider found HPI:   Initial HPI (12/04/24) Discussed the use of AI scribe software for clinical note transcription with the patient, who gave verbal consent to proceed.  Susan Buchanan is a 24 year old who presents with recurrent epistaxis. She is accompanied by her mother.   She has had an intermittent episodes of epistaxis throughout her life.  Currently experiencing daily episodes and recent episode occurred 2 to 3 days prior to this visit.  Lasted from noon to 4 5 in the afternoon, less severe than others.  Bleeding often managed with cotton ball or sticking a tissue in the nose during an episode.  Her mother had epistaxis when she was younger and had nasal cautery performed.  He is using Afrin as recommended during urgent care visit to manage bleeding.  --------------------------------------------------------- 12/05/2024  Presents for follow-up today for recurrent epistaxis after cautery was performed yesterday.  She states that 30 minutes after her visit yesterday she started bleeding.   Used Afrin but it was hard to control.  Not currently bleeding.   --------------------------------------------------------- 12/24/2024  Presents for follow-up.  Overall things have significantly improved since last visit.  She has had some recent episodes but these are small-volume compared to prior.  She still has some nasal crusting  Independent Review of Additional Tests or Records:  Hgb 12/02/24 13.4 Hgb 11/09/23 12.9   ED note 12/02/2024 Sharlet Linden, MD: Patient with recurrent epistaxis from the left side for 2 weeks, refer to  ENT  PMH/Meds/All/SocHx/FamHx/ROS:   Past Medical History:  Diagnosis Date   Anxiety    Depression    IBS (irritable bowel syndrome)    UTI (urinary tract infection)      Past Surgical History:  Procedure Laterality Date   MOUTH SURGERY     2021. Due to a baby tooth that didnt come out and an abcess    Family History  Problem Relation Age of Onset   Heart disease Mother    Rectal cancer Maternal Grandmother    Clotting disorder Maternal Grandmother    Irritable bowel syndrome Maternal Grandmother    Prostate cancer Maternal Grandfather    Esophageal cancer Neg Hx    Stomach cancer Neg Hx      Social Connections: Not on file     Current Outpatient Medications  Medication Instructions   Acetaminophen (MIDOL PO) Take by mouth. Store brand  takes as needed for menstrual cramps   Acetaminophen (TYLENOL PO) 1 tablet, As needed   dicyclomine  (BENTYL ) 10 mg, Oral, 3 times daily PRN, Please call [phone] to schedule an office visit for more refills. No more refills will be given unless an appointment is made   IBUPROFEN PO 1 tablet, As needed   pantoprazole  (PROTONIX ) 40 mg, Oral, Daily   saline (AYR) GEL 1 Application, Each Nare, Nightly     Physical Exam:   BP 115/81 (BP Location: Right Arm, Patient Position: Sitting)   Pulse 88   LMP 11/25/2024   SpO2 96%   Salient findings:  CN II-XII intact Anterior rhinoscopy: Septum slight leftward nasal septal deviation, area of concern anterior left septum that was cauterized today before.  Unable to visualize the posterior  septum or posterior aspect of the nasal cavity.  No lesions of oral cavity/oropharynx No obviously palpable neck masses/lymphadenopathy/thyromegaly No respiratory distress or stridor  Seprately Identifiable Procedures:  Prior to initiating any procedures, risks/benefits/alternatives were explained to the patient and verbal consent obtained.  PROCEDURE (12/24/2024): Rigid Nasal Endoscopy with endoscopic  control of left sided epistaxis (CPT [phone]) Pre-procedure diagnosis: Epistaxis on left  Post-procedure diagnosis: same Indication: See pre-procedure diagnosis and physical exam above Complications: None apparent EBL: 0 mL Anesthesia: Lidocaine  4% and topical decongestant was topically sprayed in each nasal cavity  Description of Procedure:  Patient was identified as correct patient.  Afrin/lidocaine  mix was sprayed into the nose in nasal cavities.   A headlight was used was only able to visualize the anterior septum.  Given her repeated episode of epistaxis, endoscopy was required to evaluate for any additional sources of bleeding.  Endoscopy was performed to evaluate the source of bleeding and to evaluate if there were any additional sources of bleeding aside from the area cauterized the day prior.  A rigid 0 degree endoscope was utilized to visualize the sinonasal cavities, mucosa, sinus ostia and turbinates and septum.  There were no additional areas of concern,, however the anterior septum on the left side started to bleed spontaneously.  Afrin was applied to this area to help with bleeding cessation but was not successful in completely stopping the bleeding.  Therefore decision was made to perform spot cauterization under the guide of the endoscope.  Silver nitrate cautery was used to perform cauterization.  She continued to bleed through this and additional hemostatic agent Surgiflo was used in the anterior nasal cavity to help with sensation.  Following this, she was hemostatic. Patient tolerated the procedure well.    Impression & Plans:  Cing Gadsden is a 24 y.o. female with   No diagnosis found.   Assessment and Plan Assessment & Plan Recurrent epistaxis Chronic recurrent epistaxis primarily on the left side, exacerbated by winter conditions. Examination reveals healed mucosa but with some prominent blood vessels that are likely the cause of her symptoms.  Cauterization was  performed with a headlight on 12/04/2024.  She presented again today with recurrent epistaxis.  Cauterization was again performed with the help of an endoscope to fully access the area of concern.  Cauterization was performed with silver nitrate as well as hemostatic agent applied with Surgiflo -Continue using Ayr gel nightly -Use Afrin during episodes spray and hold pressure for 5 minutes - Recommended use of a humidifier in the room during winter months.   Recurrent epistaxis with nasal crusting and secondary infection Recurrent anterior epistaxis has significantly improved, with only minor intermittent bleeding triggered by crying or heavy breathing. Examination demonstrated mild yellowish crusting consistent with superficial secondary bacterial infection, but overall mucosal appearance is much improved. Further cauterization is not recommended due to risk of exacerbating bleeding and trauma. Current management focuses on treating the secondary infection and maintaining mucosal hydration to prevent recurrence. - Prescribed topical mupirocin  ointment to be applied twice daily intranasally for seven days. - Instructed her to use a pea-sized amount of ointment, applied gently with a finger, avoiding deep or invasive application to minimize trauma and further bleeding. - Advised her to pause saline gel during the antibiotic course, then resume nightly saline gel after completion, especially during winter months to prevent mucosal dryness. - Provided anticipatory guidance regarding predisposition to nasal dryness and recommended prophylactic saline gel use each winter. - Instructed her to return for evaluation  if recurrent epistaxis or other concerns arise. - Sent prescription to her preferred pharmacy.    See below regarding exact medications prescribed this encounter including dosages and route: No orders of the defined types were placed in this encounter.     Thank you for allowing me the  opportunity to care for your patient. Please do not hesitate to contact me should you have any other questions.  Sincerely, Hadassah Parody, MD Otolaryngologist (ENT), [facility] Phone: [phone] Fax: [phone]

## 2024-12-27 ENCOUNTER — Other Ambulatory Visit: Payer: Self-pay | Admitting: Gastroenterology

## 2024-12-27 ENCOUNTER — Telehealth: Payer: Self-pay | Admitting: Gastroenterology

## 2024-12-27 DIAGNOSIS — K644 Residual hemorrhoidal skin tags: Secondary | ICD-10-CM

## 2024-12-27 MED ORDER — HYDROCORTISONE (PERIANAL) 2.5 % EX CREA: 1.0000 | TOPICAL_CREAM | Freq: Two times a day (BID) | CUTANEOUS | 1 refills | Status: DC

## 2024-12-27 NOTE — Telephone Encounter (Signed)
 Patient contacted the on-call service provider this evening because of a painful external hemorrhoid.  She states that she noticed the hemorrhoid in the past day or so, but over the course the day it has become much more painful.  She states that she feels a tender swollen protuberance around the anus.  She states that it is not hard or firm.  She requested a prescription hemorrhoid cream.  I advised the patient that if the hemorrhoid feels firm, that it may be thrombosed and that she may benefit from evacuation.  I advised her to consider going to the emergency room to have this looked at, as evacuation may provide immediate relief.  The patient states that she did not want to go to emergency room because it was too expensive and she had already been to the emergency room this year already.  Will prescribe Anusol  to be applied twice daily.  Also recommended that patient soak in a warm bath for 15 to 20 minutes.  Patient has office visit with Alan Coombs later this month.

## 2025-01-08 ENCOUNTER — Telehealth: Payer: Self-pay | Admitting: Physician Assistant

## 2025-01-08 ENCOUNTER — Ambulatory Visit (INDEPENDENT_AMBULATORY_CARE_PROVIDER_SITE_OTHER): Payer: Self-pay | Admitting: Physician Assistant

## 2025-01-08 ENCOUNTER — Encounter: Payer: Self-pay | Admitting: Physician Assistant

## 2025-01-08 ENCOUNTER — Ambulatory Visit (INDEPENDENT_AMBULATORY_CARE_PROVIDER_SITE_OTHER)
Admission: RE | Admit: 2025-01-08 | Discharge: 2025-01-08 | Disposition: A | Discharge status: Home / Self Care | Payer: Self-pay | Source: Ambulatory Visit | Attending: Physician Assistant | Admitting: Physician Assistant

## 2025-01-08 ENCOUNTER — Other Ambulatory Visit: Payer: Self-pay

## 2025-01-08 ENCOUNTER — Ambulatory Visit: Payer: Self-pay | Admitting: Physician Assistant

## 2025-01-08 VITALS — BP 138/78 | HR 78 | Ht 69.5 in | Wt 219.2 lb

## 2025-01-08 DIAGNOSIS — K582 Mixed irritable bowel syndrome: Secondary | ICD-10-CM

## 2025-01-08 DIAGNOSIS — N946 Dysmenorrhea, unspecified: Secondary | ICD-10-CM

## 2025-01-08 DIAGNOSIS — R1030 Lower abdominal pain, unspecified: Secondary | ICD-10-CM

## 2025-01-08 DIAGNOSIS — K625 Hemorrhage of anus and rectum: Secondary | ICD-10-CM

## 2025-01-08 DIAGNOSIS — R198 Other specified symptoms and signs involving the digestive system and abdomen: Secondary | ICD-10-CM

## 2025-01-08 DIAGNOSIS — K6289 Other specified diseases of anus and rectum: Secondary | ICD-10-CM

## 2025-01-08 DIAGNOSIS — R109 Unspecified abdominal pain: Secondary | ICD-10-CM

## 2025-01-08 DIAGNOSIS — K219 Gastro-esophageal reflux disease without esophagitis: Secondary | ICD-10-CM

## 2025-01-08 LAB — SEDIMENTATION RATE: Sed Rate: 6 mm/h (ref 0–20)

## 2025-01-08 LAB — C-REACTIVE PROTEIN: CRP: 0.5 mg/dL — ABNORMAL LOW (ref 1.0–20.0)

## 2025-01-08 LAB — COMPREHENSIVE METABOLIC PANEL WITH GFR
ALT: 9 U/L (ref 3–35)
AST: 12 U/L (ref 5–37)
Albumin: 4.5 g/dL (ref 3.5–5.2)
Alkaline Phosphatase: 64 U/L (ref 39–117)
BUN: 6 mg/dL (ref 6–23)
CO2: 29 meq/L (ref 19–32)
Calcium: 9.7 mg/dL (ref 8.4–10.5)
Chloride: 103 meq/L (ref 96–112)
Creatinine, Ser: 0.52 mg/dL (ref 0.40–1.20)
GFR: 131.69 mL/min
Glucose, Bld: 90 mg/dL (ref 70–99)
Potassium: 4 meq/L (ref 3.5–5.1)
Sodium: 139 meq/L (ref 135–145)
Total Bilirubin: 0.3 mg/dL (ref 0.2–1.2)
Total Protein: 7.5 g/dL (ref 6.0–8.3)

## 2025-01-08 LAB — CBC WITH DIFFERENTIAL/PLATELET
Basophils Absolute: 0.1 K/uL (ref 0.0–0.1)
Basophils Relative: 0.7 % (ref 0.0–3.0)
Eosinophils Absolute: 0.1 K/uL (ref 0.0–0.7)
Eosinophils Relative: 0.8 % (ref 0.0–5.0)
HCT: 36.9 % (ref 36.0–46.0)
Hemoglobin: 12.4 g/dL (ref 12.0–15.0)
Lymphocytes Relative: 28.3 % (ref 12.0–46.0)
Lymphs Abs: 1.9 K/uL (ref 0.7–4.0)
MCHC: 33.4 g/dL (ref 30.0–36.0)
MCV: 87 fl (ref 78.0–100.0)
Monocytes Absolute: 0.4 K/uL (ref 0.1–1.0)
Monocytes Relative: 6.1 % (ref 3.0–12.0)
Neutro Abs: 4.4 K/uL (ref 1.4–7.7)
Neutrophils Relative %: 64.1 % (ref 43.0–77.0)
Platelets: 324 K/uL (ref 150.0–400.0)
RBC: 4.24 Mil/uL (ref 3.87–5.11)
RDW: 13 % (ref 11.5–15.5)
WBC: 6.8 K/uL (ref 4.0–10.5)

## 2025-01-08 LAB — TSH: TSH: 0.7 u[IU]/mL (ref 0.35–5.50)

## 2025-01-08 MED ORDER — HYDROCORTISONE ACETATE 25 MG RE SUPP: 25.0000 mg | Freq: Two times a day (BID) | RECTAL | 0 refills | Status: AC

## 2025-01-08 MED ORDER — DICYCLOMINE HCL 10 MG PO CAPS: 10.0000 mg | ORAL_CAPSULE | Freq: Three times a day (TID) | ORAL | 1 refills | Status: AC | PRN

## 2025-01-08 NOTE — Telephone Encounter (Signed)
 Please advise I know yall saw her today

## 2025-01-08 NOTE — Telephone Encounter (Signed)
 Inbound call from patient stating that she is wanting Linzess prescribed to her. Patient would like for it to go to [facility] [address]. Please advise

## 2025-01-08 NOTE — Patient Instructions (Signed)
 Your provider has requested that you go to the basement level for lab work before leaving today. Press B on the elevator. The lab is located at the first door on the left as you exit the elevator.   Your provider has requested that you have an abdominal x ray before leaving today. Please go to the basement floor to our Radiology department for the test.  We have sent the following medications to your pharmacy for you to pick up at your convenience: Anusol  suppositories Bentyl    Add fiber like benefiber or citracel once a day Increase activity Can do trial of IBGard which is over the counter for AB pain- Take 1-2 capsules once a day for maintence or twice a day during a flare Can send in an anti spasm medication, Bentyl , to take as needed (we sent a prescription to your pharmacy)  You have been scheduled for a follow up appointment with Susan Buchanan on 03-11-25 at 2:30pm. Please arrive 10 minutes early for registration. If you need to reschedule or cancel this appointment please call [phone] as soon as possible. Thank you.    FODMAP stands for fermentable oligo-, di-, mono-saccharides and polyols (1). These are the scientific terms used to classify groups of carbs that are difficult for our body to digest and that are notorious for triggering digestive symptoms like bloating, gas, loose stools and stomach pain.   You can try low FODMAP diet  - start with eliminating just one column at a time that you feel may be a trigger for you. - the table at the very bottom contains foods that are low in FODMAPs   Sometimes trying to eliminate the FODMAP's from your diet is difficult or tricky, if you are stuggling with trying to do the elimination diet you can try an enzyme.  There is a food enzymes that you sprinkle in or on your food that helps break down the FODMAP. You can read more about the enzyme by going to this site: https://fodzyme.com/   Thank you for entrusting me with your care  and for choosing [facility], Susan Buchanan, P.A.-C   _______________________________________________________  If your blood pressure at your visit was 140/90 or greater, please contact your primary care physician to follow up on this.  _______________________________________________________  If you are age 54 or older, your body mass index should be between 23-30. Your Body mass index is 31.91 kg/m. If this is out of the aforementioned range listed, please consider follow up with your Primary Care Provider.  If you are age 71 or younger, your body mass index should be between 19-25. Your Body mass index is 31.91 kg/m. If this is out of the aformentioned range listed, please consider follow up with your Primary Care Provider.   ________________________________________________________  The [facility] providers would like to encourage you to use MYCHART to communicate with providers for non-urgent requests or questions.  Due to long hold times on the telephone, sending your provider a message by [facility] may be a faster and more efficient way to get a response.  Please allow 48 business hours for a response.  Please remember that this is for non-urgent requests.  _______________________________________________________  [facility] is using a team-based approach to care.  Your team is made up of your doctor and two to three APPS. Our APPS (Nurse Practitioners and Physician Assistants) work with your physician to ensure care continuity for you. They are fully qualified to address your health concerns and develop a treatment  plan. They communicate directly with your gastroenterologist to care for you. Seeing the Advanced Practice Practitioners on your physician's team can help you by facilitating care more promptly, often allowing for earlier appointments, access to diagnostic testing, procedures, and other specialty referrals.  Due to recent changes in healthcare laws, you  may see the results of your imaging and laboratory studies on MyChart before your provider has had a chance to review them.  We understand that in some cases there may be results that are confusing or concerning to you. Not all laboratory results come back in the same time frame and the provider may be waiting for multiple results in order to interpret others.  Please give us  48 hours in order for your provider to thoroughly review all the results before contacting the office for clarification of your results.

## 2025-01-08 NOTE — Progress Notes (Signed)
 "    01/08/2025 Susan Buchanan [id number] 10-22-2001  Referring provider: No ref. provider found Primary GI doctor: Dr. Federico  ASSESSMENT AND PLAN:  IBS with constipation with nausea, occ diarrhea 05/2024 CT with mesenteric adenitits 2023 CRP, thyroid  negative On dicyclomine  which is helping -Continue dicyclomine  -FODMAP,  and lifestyle changes discussed -Get Sed rate, CRP - KUB to evaluate for stool burden -Pending labs and response to medication, can consider endoscopic evaluation -Consider SIBO testing or xifaxin trial pending results - consider pelvic floor PT, information given  Rectal pain/rectal bleeding Called 12/27/2024 prescribed Anusol  which has not helped  GERD with nausea, post prandial AB pain, only eats once or twice a day, no weight loss 09/14/2022 RUQ US  unremarkable 2023 H. pylori and celiac negative, Has been pantoprazole  40 mg Will take iburpofen a week about of a month - continue pantoprazole  - consider GES, given gastroparesis diet  Dysmenorrhea Consider evaluation for endometriosis with dysmenorrhea  Has not been able to see GYN at this time  Patient Care Team: Patient, No Pcp Per as PCP - General (General Practice)  HISTORY OF PRESENT ILLNESS: 24 y.o. female with a past medical history of anxiety, depression, IBS mixed, hemorrhoids, GERD and others listed below presents for evaluation of IBS.   I last saw the patient in the office 11/09/2023 for abdominal pain, heartburn and nausea  Discussed the use of AI scribe software for clinical note transcription with the patient, who declines.   Patient states her hemorrhoids have started about 2-3 weeks ago, worsening a week ago. She feels uncomfortable with sitting but no severe pain/itching, slight burning, rare BRB on TP and in the toliet.  Using witch hazel. Tried anusol  cream that has not helped.  She struggles with constipation but states this has worsened in the last month. Will have BM every 3-5  days, normal is 5 days with one day of large volume diarrhea that causes fatigue. Every 1-2 months she can have loose stools for a month. Bilateral lower AB cramping, BM is not helpful.  Rare nocturnal symptoms.  Has nausea frequently, avoids eating large meals to avoid pain. States there was weight loss but per our records she has had weight gain.  She is lactuose intolerant and this causes diarrhea for 2-3 days, lactate free does not help.  She has not tried any medications over the counter for constipation. The dicyclomine  has helped with her bowel habits.  She is on the pantoprazole  40 mg,   Wt Readings from Last 5 Encounters:  01/08/25 219 lb 4 oz (99.5 kg)  11/09/23 214 lb (97.1 kg)  01/15/23 215 lb 6.4 oz (97.7 kg)  12/26/22 216 lb (98 kg)  11/16/22 219 lb 4 oz (99.5 kg)    She  reports that she has never smoked. She has never used smokeless tobacco. She reports that she does not drink alcohol and does not use drugs.  RELEVANT LABS AND IMAGING:  Results         06/2024 CT AB W FINDINGS: Lung bases: Clear. Liver: No significant abnormality. Focal fat infiltration along the falciform ligament. Gallbladder: Unremarkable. Spleen: Normal size and morphology. Pancreas: Unremarkable. Adrenals: Unremarkable. Kidneys: Normal symmetric enhancement. No urinary tract calculi or hydroureteronephrosis on either side. Retroaortic left renal vein noted. Bladder: Unremarkable. Reproductive Organs: Normal appearance of the uterus and bilateral ovaries. Bowel: Unremarkable. No evidence of obstruction or active inflammatory process. Normal appendix identified in the right lower quadrant. Lymph nodes: No suspicious lymph node enlargement. Increased number  of mildly prominent lymph nodes clustered in the right lower abdomen, suggestive of mesenteric adenitis. Vasculature: Normal course and caliber of the abdominal aorta and IVC. Peritoneum / Retroperitoneum: No ascites or free air. Bones:  Within normal limits. IMPRESSION: Findings suggestive of mesenteric adenitis. Otherwise unremarkable abdominal CT.  CBC    Component Value Date/Time   WBC 11.5 (H) 12/06/2024 0159   RBC 4.31 12/06/2024 0159   HGB 12.7 12/06/2024 0159   HGB 13.4 12/02/2024 1908   HCT 36.7 12/06/2024 0159   HCT 40.4 12/02/2024 1908   PLT 356 12/06/2024 0159   PLT 349 12/02/2024 1908   MCV 85.2 12/06/2024 0159   MCV 88 12/02/2024 1908   MCH 29.5 12/06/2024 0159   MCHC 34.6 12/06/2024 0159   RDW 12.0 12/06/2024 0159   RDW 12.0 12/02/2024 1908   LYMPHSABS 3.2 12/06/2024 0159   MONOABS 0.6 12/06/2024 0159   EOSABS 0.0 12/06/2024 0159   BASOSABS 0.1 12/06/2024 0159   Recent Labs    12/02/24 1908 12/06/24 0159  HGB 13.4 12.7    CMP     Component Value Date/Time   NA 138 11/09/2023 1156   K 3.6 11/09/2023 1156   CL 104 11/09/2023 1156   CO2 26 11/09/2023 1156   GLUCOSE 89 11/09/2023 1156   BUN 11 11/09/2023 1156   CREATININE 0.62 11/09/2023 1156   CALCIUM 9.2 11/09/2023 1156   PROT 7.4 11/09/2023 1156   ALBUMIN 4.4 11/09/2023 1156   AST 13 11/09/2023 1156   ALT 11 11/09/2023 1156   ALKPHOS 61 11/09/2023 1156   BILITOT 0.4 11/09/2023 1156   GFRNONAA >60 09/13/2022 2017      Latest Ref Rng & Units 11/09/2023   11:56 AM 09/13/2022    8:17 PM  Hepatic Function  Total Protein 6.0 - 8.3 g/dL 7.4  8.2   Albumin 3.5 - 5.2 g/dL 4.4  4.4   AST 0 - 37 U/L 13  17   ALT 0 - 35 U/L 11  17   Alk Phosphatase 39 - 117 U/L 61  65   Total Bilirubin 0.2 - 1.2 mg/dL 0.4  0.6       Current Medications:   Current Outpatient Medications (Respiratory):    saline (AYR) GEL, Place 1 Application into both nostrils at bedtime.  Current Outpatient Medications (Analgesics):    Acetaminophen (MIDOL PO), Take by mouth. Store brand  takes as needed for menstrual cramps   Acetaminophen (TYLENOL PO), Take 1 tablet by mouth as needed.   IBUPROFEN PO, Take 1 tablet by mouth as needed.  Current Outpatient  Medications (Other):    dicyclomine  (BENTYL ) 10 MG capsule, Take 1 capsule (10 mg total) by mouth 3 (three) times daily as needed for spasms. Please call [phone] to schedule an office visit for more refills. No more refills will be given unless an appointment is made   hydrocortisone  (ANUSOL -HC) 2.5 % rectal cream, Place 1 Application rectally 2 (two) times daily.   pantoprazole  (PROTONIX ) 40 MG tablet, Take 1 tablet by mouth once daily  Medical History:  Past Medical History:  Diagnosis Date   Anxiety    Depression    IBS (irritable bowel syndrome)    UTI (urinary tract infection)    Allergies:  Allergies  Allergen Reactions   Latex Other (See Comments)    Allergic to bandaids   Wound Dressing Adhesive Rash     Surgical History:  She  has a past surgical history that includes Mouth surgery.  Family History:  Her family history includes Clotting disorder in her maternal grandmother; Heart disease in her mother; Irritable bowel syndrome in her maternal grandmother; Prostate cancer in her maternal grandfather; Rectal cancer in her maternal grandmother.  REVIEW OF SYSTEMS  : All other systems reviewed and negative except where noted in the History of Present Illness.  PHYSICAL EXAM: BP 138/78   Pulse 78   Ht 5' 9.5 (1.765 m)   Wt 219 lb 4 oz (99.5 kg)   LMP 01/01/2025 (Approximate)   BMI 31.91 kg/m  General Appearance: Well nourished, in no apparent distress. Head:   Normocephalic and atraumatic. Eyes:  sclerae anicteric,conjunctive pink  Respiratory: Respiratory effort normal, BS equal bilaterally without rales, rhonchi, wheezing. Cardio: RRR with no MRGs. Peripheral pulses intact.  Abdomen: Soft,  Non-distended ,active bowel sounds. mild tenderness in the entire abdomen. Without guarding and Without rebound. No masses. Rectal: Not evaluated Musculoskeletal: Full ROM, Normal gait. Without edema. Skin:  Dry and intact without significant lesions or rashes Neuro: Alert  and  oriented x4;  No focal deficits. Psych:  Cooperative. Normal mood and affect.    Alan JONELLE Coombs, PA-C 2:47 PM   "

## 2025-01-09 MED ORDER — LINACLOTIDE 145 MCG PO CAPS: 145.0000 ug | ORAL_CAPSULE | Freq: Every day | ORAL | 1 refills | Status: AC

## 2025-01-09 NOTE — Telephone Encounter (Signed)
"  I sent in linzess 145 mcg to the pharmacy. Can send her the linzess dot phrase. Thanks! "

## 2025-01-23 ENCOUNTER — Other Ambulatory Visit: Payer: Self-pay | Admitting: Physician Assistant

## 2025-03-11 ENCOUNTER — Ambulatory Visit: Payer: Self-pay | Admitting: Physician Assistant
